# Patient Record
Sex: Female | Born: 1970 | Race: White | Hispanic: No | Marital: Married | Smoking: Never smoker
Health system: Southern US, Community
[De-identification: ages and names within clinical notes are randomized; demographics above are authoritative.]

## PROBLEM LIST (undated history)

## (undated) DIAGNOSIS — M8580 Other specified disorders of bone density and structure, unspecified site: Secondary | ICD-10-CM

## (undated) DIAGNOSIS — K589 Irritable bowel syndrome without diarrhea: Secondary | ICD-10-CM

## (undated) DIAGNOSIS — T7840XA Allergy, unspecified, initial encounter: Secondary | ICD-10-CM

## (undated) DIAGNOSIS — K222 Esophageal obstruction: Secondary | ICD-10-CM

## (undated) HISTORY — DX: Other specified disorders of bone density and structure, unspecified site: M85.80

## (undated) HISTORY — DX: Irritable bowel syndrome without diarrhea: K58.9

## (undated) HISTORY — DX: Esophageal obstruction: K22.2

## (undated) HISTORY — DX: Allergy, unspecified, initial encounter: T78.40XA

## (undated) HISTORY — PX: CERVICAL BIOPSY  W/ LOOP ELECTRODE EXCISION: SUR135

---

## 1996-04-02 HISTORY — PX: BREAST BIOPSY: SHX20

## 1997-09-03 ENCOUNTER — Ambulatory Visit (HOSPITAL_COMMUNITY): Admission: RE | Admit: 1997-09-03 | Discharge: 1997-09-03 | Payer: Self-pay | Admitting: *Deleted

## 1998-08-30 ENCOUNTER — Other Ambulatory Visit: Admission: RE | Admit: 1998-08-30 | Discharge: 1998-08-30 | Payer: Self-pay | Admitting: Obstetrics and Gynecology

## 1999-06-20 ENCOUNTER — Ambulatory Visit (HOSPITAL_COMMUNITY): Admission: RE | Admit: 1999-06-20 | Discharge: 1999-06-20 | Payer: Self-pay | Admitting: Obstetrics and Gynecology

## 1999-06-20 ENCOUNTER — Encounter: Payer: Self-pay | Admitting: Obstetrics and Gynecology

## 2000-10-17 ENCOUNTER — Other Ambulatory Visit: Admission: RE | Admit: 2000-10-17 | Discharge: 2000-10-17 | Payer: Self-pay | Admitting: Obstetrics and Gynecology

## 2001-04-04 ENCOUNTER — Ambulatory Visit (HOSPITAL_COMMUNITY): Admission: RE | Admit: 2001-04-04 | Discharge: 2001-04-04 | Payer: Self-pay | Admitting: Internal Medicine

## 2001-04-28 ENCOUNTER — Other Ambulatory Visit: Admission: RE | Admit: 2001-04-28 | Discharge: 2001-04-28 | Payer: Self-pay | Admitting: Obstetrics and Gynecology

## 2002-05-19 ENCOUNTER — Other Ambulatory Visit: Admission: RE | Admit: 2002-05-19 | Discharge: 2002-05-19 | Payer: Self-pay | Admitting: Obstetrics and Gynecology

## 2003-08-06 ENCOUNTER — Other Ambulatory Visit: Admission: RE | Admit: 2003-08-06 | Discharge: 2003-08-06 | Payer: Self-pay | Admitting: Obstetrics and Gynecology

## 2004-07-20 ENCOUNTER — Other Ambulatory Visit: Admission: RE | Admit: 2004-07-20 | Discharge: 2004-07-20 | Payer: Self-pay | Admitting: Obstetrics and Gynecology

## 2004-08-17 ENCOUNTER — Ambulatory Visit (HOSPITAL_COMMUNITY): Admission: RE | Admit: 2004-08-17 | Discharge: 2004-08-17 | Payer: Self-pay | Admitting: Family Medicine

## 2005-06-13 ENCOUNTER — Inpatient Hospital Stay (HOSPITAL_COMMUNITY): Admission: AD | Admit: 2005-06-13 | Discharge: 2005-06-17 | Payer: Self-pay | Admitting: Obstetrics and Gynecology

## 2005-07-23 ENCOUNTER — Other Ambulatory Visit: Admission: RE | Admit: 2005-07-23 | Discharge: 2005-07-23 | Payer: Self-pay | Admitting: Obstetrics and Gynecology

## 2011-10-28 ENCOUNTER — Emergency Department (HOSPITAL_COMMUNITY)
Admission: EM | Admit: 2011-10-28 | Discharge: 2011-10-28 | Disposition: A | Discharge status: Home / Self Care | Payer: 59 | Source: Home / Self Care

## 2014-01-22 ENCOUNTER — Other Ambulatory Visit (HOSPITAL_COMMUNITY): Payer: Self-pay | Admitting: Obstetrics and Gynecology

## 2014-01-22 DIAGNOSIS — R1011 Right upper quadrant pain: Secondary | ICD-10-CM

## 2014-01-27 ENCOUNTER — Ambulatory Visit (HOSPITAL_COMMUNITY): Payer: 59

## 2014-06-11 ENCOUNTER — Other Ambulatory Visit (HOSPITAL_COMMUNITY)
Admission: RE | Admit: 2014-06-11 | Discharge: 2014-06-11 | Disposition: A | Discharge status: Home / Self Care | Payer: 59 | Source: Ambulatory Visit | Attending: Obstetrics and Gynecology | Admitting: Obstetrics and Gynecology

## 2014-06-11 DIAGNOSIS — R05 Cough: Secondary | ICD-10-CM | POA: Diagnosis present

## 2014-06-11 LAB — CBC
HCT: 35.1 % — ABNORMAL LOW (ref 36.0–46.0)
Hemoglobin: 12.8 g/dL (ref 12.0–15.0)
MCH: 33.9 pg (ref 26.0–34.0)
MCHC: 36.5 g/dL — ABNORMAL HIGH (ref 30.0–36.0)
MCV: 92.9 fL (ref 78.0–100.0)
Platelets: 201 10*3/uL (ref 150–400)
RBC: 3.78 MIL/uL — ABNORMAL LOW (ref 3.87–5.11)
RDW: 11.6 % (ref 11.5–15.5)
WBC: 5.7 10*3/uL (ref 4.0–10.5)

## 2014-06-11 LAB — COMPREHENSIVE METABOLIC PANEL
ALT: 11 U/L (ref 0–35)
AST: 20 U/L (ref 0–37)
Albumin: 4.1 g/dL (ref 3.5–5.2)
Alkaline Phosphatase: 40 U/L (ref 39–117)
Anion gap: 12 (ref 5–15)
BUN: 8 mg/dL (ref 6–23)
CO2: 30 mmol/L (ref 19–32)
Calcium: 9.2 mg/dL (ref 8.4–10.5)
Chloride: 97 mmol/L (ref 96–112)
Creatinine, Ser: 0.62 mg/dL (ref 0.50–1.10)
GFR calc Af Amer: >90 mL/min (ref >90)
GFR calc non Af Amer: >90 mL/min (ref >90)
Glucose, Bld: 116 mg/dL — ABNORMAL HIGH (ref 70–99)
Potassium: 3.7 mmol/L (ref 3.5–5.1)
Sodium: 139 mmol/L (ref 135–145)
Total Bilirubin: 1 mg/dL (ref 0.3–1.2)
Total Protein: 7 g/dL (ref 6.0–8.3)

## 2014-06-11 LAB — TSH: TSH: 1.36 u[IU]/mL (ref 0.350–4.500)

## 2014-06-11 LAB — VITAMIN B12: Vitamin B-12: 832 pg/mL (ref 211–911)

## 2014-06-12 LAB — PROLACTIN: Prolactin: 7.3 ng/mL (ref 4.8–23.3)

## 2014-12-21 ENCOUNTER — Other Ambulatory Visit (HOSPITAL_COMMUNITY)
Admission: AD | Admit: 2014-12-21 | Discharge: 2014-12-21 | Disposition: A | Discharge status: Home / Self Care | Payer: 59 | Source: Ambulatory Visit | Attending: Obstetrics and Gynecology | Admitting: Obstetrics and Gynecology

## 2014-12-21 DIAGNOSIS — E559 Vitamin D deficiency, unspecified: Secondary | ICD-10-CM | POA: Diagnosis not present

## 2014-12-22 LAB — VITAMIN D 25 HYDROXY (VIT D DEFICIENCY, FRACTURES): Vit D, 25-Hydroxy: 35.4 ng/mL (ref 30.0–100.0)

## 2015-05-10 DIAGNOSIS — D2271 Melanocytic nevi of right lower limb, including hip: Secondary | ICD-10-CM | POA: Diagnosis not present

## 2015-05-10 DIAGNOSIS — L821 Other seborrheic keratosis: Secondary | ICD-10-CM | POA: Diagnosis not present

## 2015-05-10 DIAGNOSIS — L718 Other rosacea: Secondary | ICD-10-CM | POA: Diagnosis not present

## 2015-05-10 DIAGNOSIS — D2272 Melanocytic nevi of left lower limb, including hip: Secondary | ICD-10-CM | POA: Diagnosis not present

## 2015-05-10 DIAGNOSIS — D485 Neoplasm of uncertain behavior of skin: Secondary | ICD-10-CM | POA: Diagnosis not present

## 2015-05-10 DIAGNOSIS — L814 Other melanin hyperpigmentation: Secondary | ICD-10-CM | POA: Diagnosis not present

## 2015-11-14 DIAGNOSIS — H524 Presbyopia: Secondary | ICD-10-CM | POA: Diagnosis not present

## 2015-11-14 DIAGNOSIS — H52223 Regular astigmatism, bilateral: Secondary | ICD-10-CM | POA: Diagnosis not present

## 2015-11-23 DIAGNOSIS — L821 Other seborrheic keratosis: Secondary | ICD-10-CM | POA: Diagnosis not present

## 2015-12-27 DIAGNOSIS — Z01419 Encounter for gynecological examination (general) (routine) without abnormal findings: Secondary | ICD-10-CM | POA: Diagnosis not present

## 2015-12-27 DIAGNOSIS — Z13228 Encounter for screening for other metabolic disorders: Secondary | ICD-10-CM | POA: Diagnosis not present

## 2015-12-27 DIAGNOSIS — Z1231 Encounter for screening mammogram for malignant neoplasm of breast: Secondary | ICD-10-CM | POA: Diagnosis not present

## 2015-12-27 DIAGNOSIS — Z6822 Body mass index (BMI) 22.0-22.9, adult: Secondary | ICD-10-CM | POA: Diagnosis not present

## 2015-12-27 DIAGNOSIS — Z131 Encounter for screening for diabetes mellitus: Secondary | ICD-10-CM | POA: Diagnosis not present

## 2015-12-27 DIAGNOSIS — Z1321 Encounter for screening for nutritional disorder: Secondary | ICD-10-CM | POA: Diagnosis not present

## 2015-12-27 DIAGNOSIS — Z1329 Encounter for screening for other suspected endocrine disorder: Secondary | ICD-10-CM | POA: Diagnosis not present

## 2015-12-27 DIAGNOSIS — Z130 Encounter for screening for diseases of the blood and blood-forming organs and certain disorders involving the immune mechanism: Secondary | ICD-10-CM | POA: Diagnosis not present

## 2016-05-17 ENCOUNTER — Encounter: Payer: Self-pay | Admitting: Physician Assistant

## 2016-05-28 ENCOUNTER — Other Ambulatory Visit: Payer: Self-pay | Admitting: *Deleted

## 2016-05-30 ENCOUNTER — Ambulatory Visit: Payer: 59 | Admitting: Physician Assistant

## 2016-06-05 DIAGNOSIS — D2272 Melanocytic nevi of left lower limb, including hip: Secondary | ICD-10-CM | POA: Diagnosis not present

## 2016-06-05 DIAGNOSIS — L718 Other rosacea: Secondary | ICD-10-CM | POA: Diagnosis not present

## 2016-06-05 DIAGNOSIS — D2271 Melanocytic nevi of right lower limb, including hip: Secondary | ICD-10-CM | POA: Diagnosis not present

## 2016-06-05 DIAGNOSIS — D485 Neoplasm of uncertain behavior of skin: Secondary | ICD-10-CM | POA: Diagnosis not present

## 2016-06-05 DIAGNOSIS — L821 Other seborrheic keratosis: Secondary | ICD-10-CM | POA: Diagnosis not present

## 2016-06-05 DIAGNOSIS — D225 Melanocytic nevi of trunk: Secondary | ICD-10-CM | POA: Diagnosis not present

## 2016-06-05 DIAGNOSIS — D224 Melanocytic nevi of scalp and neck: Secondary | ICD-10-CM | POA: Diagnosis not present

## 2016-06-20 ENCOUNTER — Ambulatory Visit: Payer: 59 | Admitting: Gastroenterology

## 2016-12-27 DIAGNOSIS — D2272 Melanocytic nevi of left lower limb, including hip: Secondary | ICD-10-CM | POA: Diagnosis not present

## 2016-12-27 DIAGNOSIS — D225 Melanocytic nevi of trunk: Secondary | ICD-10-CM | POA: Diagnosis not present

## 2016-12-27 DIAGNOSIS — D485 Neoplasm of uncertain behavior of skin: Secondary | ICD-10-CM | POA: Diagnosis not present

## 2016-12-27 DIAGNOSIS — D2271 Melanocytic nevi of right lower limb, including hip: Secondary | ICD-10-CM | POA: Diagnosis not present

## 2016-12-27 DIAGNOSIS — L718 Other rosacea: Secondary | ICD-10-CM | POA: Diagnosis not present

## 2016-12-27 DIAGNOSIS — L821 Other seborrheic keratosis: Secondary | ICD-10-CM | POA: Diagnosis not present

## 2017-01-29 DIAGNOSIS — Z01419 Encounter for gynecological examination (general) (routine) without abnormal findings: Secondary | ICD-10-CM | POA: Diagnosis not present

## 2017-01-29 DIAGNOSIS — Z13228 Encounter for screening for other metabolic disorders: Secondary | ICD-10-CM | POA: Diagnosis not present

## 2017-01-29 DIAGNOSIS — Z1321 Encounter for screening for nutritional disorder: Secondary | ICD-10-CM | POA: Diagnosis not present

## 2017-01-29 DIAGNOSIS — Z13220 Encounter for screening for lipoid disorders: Secondary | ICD-10-CM | POA: Diagnosis not present

## 2017-01-29 DIAGNOSIS — Z1231 Encounter for screening mammogram for malignant neoplasm of breast: Secondary | ICD-10-CM | POA: Diagnosis not present

## 2017-01-29 DIAGNOSIS — Z130 Encounter for screening for diseases of the blood and blood-forming organs and certain disorders involving the immune mechanism: Secondary | ICD-10-CM | POA: Diagnosis not present

## 2017-01-29 DIAGNOSIS — Z6821 Body mass index (BMI) 21.0-21.9, adult: Secondary | ICD-10-CM | POA: Diagnosis not present

## 2017-01-29 LAB — HM PAP SMEAR: HM Pap smear: NEGATIVE

## 2017-06-27 DIAGNOSIS — D2271 Melanocytic nevi of right lower limb, including hip: Secondary | ICD-10-CM | POA: Diagnosis not present

## 2017-06-27 DIAGNOSIS — L570 Actinic keratosis: Secondary | ICD-10-CM | POA: Diagnosis not present

## 2017-06-27 DIAGNOSIS — L821 Other seborrheic keratosis: Secondary | ICD-10-CM | POA: Diagnosis not present

## 2017-06-27 DIAGNOSIS — L718 Other rosacea: Secondary | ICD-10-CM | POA: Diagnosis not present

## 2017-06-27 DIAGNOSIS — D485 Neoplasm of uncertain behavior of skin: Secondary | ICD-10-CM | POA: Diagnosis not present

## 2017-06-27 DIAGNOSIS — D225 Melanocytic nevi of trunk: Secondary | ICD-10-CM | POA: Diagnosis not present

## 2017-06-27 DIAGNOSIS — D2272 Melanocytic nevi of left lower limb, including hip: Secondary | ICD-10-CM | POA: Diagnosis not present

## 2017-10-07 DIAGNOSIS — H524 Presbyopia: Secondary | ICD-10-CM | POA: Diagnosis not present

## 2017-10-07 DIAGNOSIS — H5201 Hypermetropia, right eye: Secondary | ICD-10-CM | POA: Diagnosis not present

## 2017-10-07 DIAGNOSIS — H5212 Myopia, left eye: Secondary | ICD-10-CM | POA: Diagnosis not present

## 2017-10-07 DIAGNOSIS — H52223 Regular astigmatism, bilateral: Secondary | ICD-10-CM | POA: Diagnosis not present

## 2017-12-12 DIAGNOSIS — L821 Other seborrheic keratosis: Secondary | ICD-10-CM | POA: Diagnosis not present

## 2017-12-12 DIAGNOSIS — D2262 Melanocytic nevi of left upper limb, including shoulder: Secondary | ICD-10-CM | POA: Diagnosis not present

## 2017-12-12 DIAGNOSIS — D225 Melanocytic nevi of trunk: Secondary | ICD-10-CM | POA: Diagnosis not present

## 2017-12-12 DIAGNOSIS — D2272 Melanocytic nevi of left lower limb, including hip: Secondary | ICD-10-CM | POA: Diagnosis not present

## 2017-12-12 DIAGNOSIS — D2261 Melanocytic nevi of right upper limb, including shoulder: Secondary | ICD-10-CM | POA: Diagnosis not present

## 2018-01-20 ENCOUNTER — Ambulatory Visit: Payer: 59 | Admitting: Family Medicine

## 2018-02-06 DIAGNOSIS — Z1231 Encounter for screening mammogram for malignant neoplasm of breast: Secondary | ICD-10-CM | POA: Diagnosis not present

## 2018-02-06 DIAGNOSIS — Z6822 Body mass index (BMI) 22.0-22.9, adult: Secondary | ICD-10-CM | POA: Diagnosis not present

## 2018-02-06 DIAGNOSIS — Z01419 Encounter for gynecological examination (general) (routine) without abnormal findings: Secondary | ICD-10-CM | POA: Diagnosis not present

## 2018-02-06 LAB — HM MAMMOGRAPHY

## 2018-03-11 ENCOUNTER — Encounter: Payer: Self-pay | Admitting: Nurse Practitioner

## 2018-04-09 ENCOUNTER — Encounter: Payer: Self-pay | Admitting: Family Medicine

## 2018-04-09 ENCOUNTER — Ambulatory Visit: Payer: 59 | Admitting: Family Medicine

## 2018-04-09 VITALS — BP 118/64 | HR 72 | Temp 98.6°F | Ht 63.0 in | Wt 120.4 lb

## 2018-04-09 DIAGNOSIS — Z13220 Encounter for screening for lipoid disorders: Secondary | ICD-10-CM

## 2018-04-09 DIAGNOSIS — Z1331 Encounter for screening for depression: Secondary | ICD-10-CM

## 2018-04-09 DIAGNOSIS — Z9189 Other specified personal risk factors, not elsewhere classified: Secondary | ICD-10-CM

## 2018-04-09 DIAGNOSIS — Z0000 Encounter for general adult medical examination without abnormal findings: Secondary | ICD-10-CM | POA: Diagnosis not present

## 2018-04-09 LAB — COMPREHENSIVE METABOLIC PANEL
ALT: 10 U/L (ref 0–35)
AST: 20 U/L (ref 0–37)
Albumin: 4.8 g/dL (ref 3.5–5.2)
Alkaline Phosphatase: 42 U/L (ref 39–117)
BUN: 11 mg/dL (ref 6–23)
CO2: 27 mEq/L (ref 19–32)
Calcium: 9.9 mg/dL (ref 8.4–10.5)
Chloride: 101 mEq/L (ref 96–112)
Creatinine, Ser: 0.78 mg/dL (ref 0.40–1.20)
GFR: 84.58 mL/min (ref >60.00)
Glucose, Bld: 94 mg/dL (ref 70–99)
Potassium: 4.7 mEq/L (ref 3.5–5.1)
Sodium: 138 mEq/L (ref 135–145)
Total Bilirubin: 0.9 mg/dL (ref 0.2–1.2)
Total Protein: 7.3 g/dL (ref 6.0–8.3)

## 2018-04-09 LAB — CBC WITH DIFFERENTIAL/PLATELET
Basophils Absolute: 0.1 10*3/uL (ref 0.0–0.1)
Basophils Relative: 1 % (ref 0.0–3.0)
Eosinophils Absolute: 0.1 10*3/uL (ref 0.0–0.7)
Eosinophils Relative: 1.2 % (ref 0.0–5.0)
HCT: 42.3 % (ref 36.0–46.0)
Hemoglobin: 14.9 g/dL (ref 12.0–15.0)
Lymphocytes Relative: 26.2 % (ref 12.0–46.0)
Lymphs Abs: 2 10*3/uL (ref 0.7–4.0)
MCHC: 35.2 g/dL (ref 30.0–36.0)
MCV: 98.6 fl (ref 78.0–100.0)
Monocytes Absolute: 0.4 10*3/uL (ref 0.1–1.0)
Monocytes Relative: 5.1 % (ref 3.0–12.0)
Neutro Abs: 5.2 10*3/uL (ref 1.4–7.7)
Neutrophils Relative %: 66.5 % (ref 43.0–77.0)
Platelets: 287 10*3/uL (ref 150.0–400.0)
RBC: 4.29 Mil/uL (ref 3.87–5.11)
RDW: 12.3 % (ref 11.5–15.5)
WBC: 7.8 10*3/uL (ref 4.0–10.5)

## 2018-04-09 LAB — LIPID PANEL
Cholesterol: 155 mg/dL (ref 0–200)
HDL: 81.7 mg/dL (ref >39.00)
LDL Cholesterol: 62 mg/dL (ref 0–99)
NonHDL: 73.53
Total CHOL/HDL Ratio: 2
Triglycerides: 60 mg/dL (ref 0.0–149.0)
VLDL: 12 mg/dL (ref 0.0–40.0)

## 2018-04-09 NOTE — Progress Notes (Signed)
   Subjective:    Carolyn Guzman is a 48 y.o. female and is here for a comprehensive physical exam.  Health Maintenance Due  Topic Date Due  . HIV Screening  06/16/1987  . TETANUS/TDAP  06/16/1991  . PAP SMEAR-Modifier  06/15/1993   PMHx, SurgHx, SocialHx, Medications, and Allergies were reviewed in the Visit Navigator and updated as appropriate.   Past Medical History:  Diagnosis Date  . Esophageal stricture   . IBS (irritable bowel syndrome)      Past Surgical History:  Procedure Laterality Date  . BREAST BIOPSY Left 1998  . CESAREAN SECTION  2007     Family History  Problem Relation Age of Onset  . Depression Mother   . Hypertension Mother    Social History   Tobacco Use  . Smoking status: Never Smoker  . Smokeless tobacco: Never Used  Substance Use Topics  . Alcohol use: Yes  . Drug use: Never   Review of Systems:   Pertinent items are noted in the HPI. Otherwise, ROS is negative.  Objective:   BP 118/64   Pulse 72   Temp 98.6 F (37 C) (Oral)   Ht 5\' 3"  (1.6 m)   Wt 120 lb 6.4 oz (54.6 kg)   SpO2 98%   BMI 21.33 kg/m   General appearance: alert, cooperative and appears stated age. Head: normocephalic, without obvious abnormality, atraumatic. Neck: no adenopathy, supple, symmetrical, trachea midline; thyroid not enlarged, symmetric, no tenderness/mass/nodules. Lungs: clear to auscultation bilaterally. Heart: regular rate and rhythm Abdomen: soft, non-tender; no masses,  no organomegaly. Extremities: extremities normal, atraumatic, no cyanosis or edema. Skin: skin color, texture, turgor normal, no rashes or lesions. Lymph: cervical, supraclavicular, and axillary nodes normal; no abnormal inguinal nodes palpated. Neurologic: grossly normal.  Assessment/Plan:   Carolyn Guzman was seen today for establish care.  Diagnoses and all orders for this visit:  Routine physical examination  Screening for lipid disorders -     Lipid panel  History  of weight change -     CBC with Differential/Platelet -     Comprehensive metabolic panel    Patient Counseling: [x]    Nutrition: Stressed importance of moderation in sodium/caffeine intake, saturated fat and cholesterol, caloric balance, sufficient intake of fresh fruits, vegetables, fiber, calcium, iron, and 1 mg of folate supplement per day (for females capable of pregnancy).  [x]    Stressed the importance of regular exercise.   [x]    Substance Abuse: Discussed cessation/primary prevention of tobacco, alcohol, or other drug use; driving or other dangerous activities under the influence; availability of treatment for abuse.   [x]    Injury prevention: Discussed safety belts, safety helmets, smoke detector, smoking near bedding or upholstery.   [x]    Sexuality: Discussed sexually transmitted diseases, partner selection, use of condoms, avoidance of unintended pregnancy  and contraceptive alternatives.  [x]    Dental health: Discussed importance of regular tooth brushing, flossing, and dental visits.  [x]    Health maintenance and immunizations reviewed. Please refer to Health maintenance section.   Briscoe Deutscher, DO [place]

## 2018-04-17 ENCOUNTER — Ambulatory Visit: Payer: 59 | Admitting: Physician Assistant

## 2018-04-17 ENCOUNTER — Ambulatory Visit: Payer: 59 | Admitting: Nurse Practitioner

## 2018-04-17 ENCOUNTER — Encounter: Payer: Self-pay | Admitting: Physician Assistant

## 2018-04-17 VITALS — BP 118/64 | HR 68 | Ht 63.0 in | Wt 120.0 lb

## 2018-04-17 DIAGNOSIS — K581 Irritable bowel syndrome with constipation: Secondary | ICD-10-CM | POA: Diagnosis not present

## 2018-04-17 NOTE — Progress Notes (Addendum)
Chief Complaint: IBS  HPI:    Carolyn Guzman is a 48 year old Caucasian female with a past medical history as listed below, who presents to clinic today with a complaint of IBS.    04/09/2018 CBC, CMP and lipid panel were normal.    Today, the patient presents to clinic and explains that she is really just here to establish care.  She was diagnosed with IBS years ago in her 27s and has dealt with off-and-on symptoms of this over the years, but is pretty good at controlling them with her diet and lifestyle. Typically constipated and currently has been using a lot of "organic cleanses" in order to maintain regular bowel movements.  Has tried most over-the-counter laxatives in the past including MiraLAX which she did not like because it made her so gassy.  Has also tried a prescription medicine but likes to try and avoid these if she can and do things the natural way.      Does admit to eating a healthy diet and doing CrossFit on a regular basis, but unsure if she is meeting the fiber requirements of 25-35 g/day.  Also works as a Marine scientist in TEFL teacher at [employer] and tells me that she is somewhat of a hypochondriac which makes her anxious.    Denies fever, chills, weight loss, blood in her stool, nausea, vomiting, heartburn, reflux or symptoms that awaken her from sleep.  Past Medical History:  Diagnosis Date  . Esophageal stricture   . IBS (irritable bowel syndrome)     Past Surgical History:  Procedure Laterality Date  . BREAST BIOPSY Left 1998  . CESAREAN SECTION  2007    No current outpatient medications on file.   No current facility-administered medications for this visit.     Allergies as of 04/17/2018  . (No Known Allergies)    Family History  Problem Relation Age of Onset  . Depression Mother   . Hypertension Mother     Social History   Socioeconomic History  . Marital status: Married    Spouse name: Not on file  . Number of children: Not on file  . Years of  education: Not on file  . Highest education level: Not on file  Occupational History  . Occupation: Therapist, sports PRE[employer]    Employer: [employer]  Social Needs  . Financial resource strain: Not on file  . Food insecurity:    Worry: Not on file    Inability: Not on file  . Transportation needs:    Medical: Not on file    Non-medical: Not on file  Tobacco Use  . Smoking status: Never Smoker  . Smokeless tobacco: Never Used  Substance and Sexual Activity  . Alcohol use: Yes  . Drug use: Never  . Sexual activity: Yes    Partners: Male  Lifestyle  . Physical activity:    Days per week: Not on file    Minutes per session: Not on file  . Stress: Not on file  Relationships  . Social connections:    Talks on phone: Not on file    Gets together: Not on file    Attends religious service: Not on file    Active member of club or organization: Not on file    Attends meetings of clubs or organizations: Not on file    Relationship status: Not on file  . Intimate partner violence:    Fear of current or ex partner: Not on file    Emotionally abused:  Not on file    Physically abused: Not on file    Forced sexual activity: Not on file  Other Topics Concern  . 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     Review of Systems:    Constitutional: No weight loss, fever or chills Skin: No rash  Cardiovascular: No chest pain  Respiratory: No SOB Gastrointestinal: See HPI and otherwise negative Genitourinary: No dysuria  Neurological: No headache, dizziness or syncope Musculoskeletal: No new muscle or joint pain Hematologic: No bleeding  Psychiatric: No history of depression or anxiety   Physical Exam:  Vital signs: BP 118/64   Pulse 68   Ht 5\' 3"  (1.6 m)   Wt 120 lb (54.4 kg)   BMI 21.26 kg/m   Constitutional:   Pleasant Caucasian female appears to be in NAD, Well developed, Well nourished, alert and cooperative Head:  Normocephalic and  atraumatic. Eyes:   PEERL, EOMI. No icterus. Conjunctiva pink. Ears:  Normal auditory acuity. Neck:  Supple Throat: Oral cavity and pharynx without inflammation, swelling or lesion.  Respiratory: Respirations even and unlabored. Lungs clear to auscultation bilaterally.   No wheezes, crackles, or rhonchi.  Cardiovascular: Normal S1, S2. No MRG. Regular rate and rhythm. No peripheral edema, cyanosis or pallor.  Gastrointestinal:  Soft, nondistended, nontender. No rebound or guarding. Normal bowel sounds. No appreciable masses or hepatomegaly. Rectal:  Not performed.  Msk:  Symmetrical without gross deformities. Without edema, no deformity or joint abnormality.  Neurologic:  Alert and  oriented x4;  grossly normal neurologically.  Skin:   Dry and intact without significant lesions or rashes. Psychiatric: Demonstrates good judgement and reason without abnormal affect or behaviors.  RELEVANT LABS AND IMAGING: CBC    Component Value Date/Time   WBC 7.8 04/09/2018 0952   RBC 4.29 04/09/2018 0952   HGB 14.9 04/09/2018 0952   HCT 42.3 04/09/2018 0952   PLT 287.0 04/09/2018 0952   MCV 98.6 04/09/2018 0952   MCH 33.9 06/11/2014 0800   MCHC 35.2 04/09/2018 0952   RDW 12.3 04/09/2018 0952   LYMPHSABS 2.0 04/09/2018 0952   MONOABS 0.4 04/09/2018 0952   EOSABS 0.1 04/09/2018 0952   BASOSABS 0.1 04/09/2018 0952    CMP     Component Value Date/Time   NA 138 04/09/2018 0952   K 4.7 04/09/2018 0952   CL 101 04/09/2018 0952   CO2 27 04/09/2018 0952   GLUCOSE 94 04/09/2018 0952   BUN 11 04/09/2018 0952   CREATININE 0.78 04/09/2018 0952   CALCIUM 9.9 04/09/2018 0952   PROT 7.3 04/09/2018 0952   ALBUMIN 4.8 04/09/2018 0952   AST 20 04/09/2018 0952   ALT 10 04/09/2018 0952   ALKPHOS 42 04/09/2018 0952   BILITOT 0.9 04/09/2018 0952   GFRNONAA >90 06/11/2014 0800   GFRAA >90 06/11/2014 0800    Assessment: 1. IBS-C: Chronic for the patient, mostly controlled with diet and  lifestyle  Plan: 1.  Reports having EGD and colonoscopy back in 2003 with Dr. Henrene Pastor but is requesting to switch her care to Dr. Carlean Purl today. 2.  Discussed IBS.  Provided her with a copy of the low FODMAP diet today and discussed this in detail. 3.  Discussed that cleanses may not be the best way to go to keep regular bowel movements, encouraged her to increase fiber at first to give this a try.  Discussed 25-35 g/day through her diet or a fiber supplement. 4.  Discussed probiotics, she has tried some in the past but felt they made no difference. 5.  Discussed anti-spasmodics but the patient declines today. 6.  Patient to follow in clinic as needed with Dr. Carlean Purl or myself.  Ellouise Newer, PA-C [facility] 04/17/2018, 1:45 PM  Cc: Briscoe Deutscher, DO   Agree with Ms. Mikaia Janvier's evaluation and management.  Gatha Mayer, MD, [facility]

## 2018-04-17 NOTE — Patient Instructions (Addendum)
[email or web address]  Please check out the above web site for information about [employer]. We are also giving you information to read over today.   Follow up with [place] as needed.    I appreciate the opportunity to care for you.

## 2018-04-17 NOTE — Progress Notes (Signed)
Noted. Will receive primary GI care from Dr. Carlean Purl

## 2018-04-22 ENCOUNTER — Encounter: Payer: Self-pay | Admitting: Physical Therapy

## 2018-06-16 DIAGNOSIS — N951 Menopausal and female climacteric states: Secondary | ICD-10-CM | POA: Diagnosis not present

## 2018-07-08 DIAGNOSIS — R1032 Left lower quadrant pain: Secondary | ICD-10-CM | POA: Diagnosis not present

## 2018-07-08 DIAGNOSIS — K5900 Constipation, unspecified: Secondary | ICD-10-CM | POA: Diagnosis not present

## 2018-09-16 DIAGNOSIS — D2272 Melanocytic nevi of left lower limb, including hip: Secondary | ICD-10-CM | POA: Diagnosis not present

## 2018-09-16 DIAGNOSIS — L814 Other melanin hyperpigmentation: Secondary | ICD-10-CM | POA: Diagnosis not present

## 2018-09-16 DIAGNOSIS — D485 Neoplasm of uncertain behavior of skin: Secondary | ICD-10-CM | POA: Diagnosis not present

## 2018-09-16 DIAGNOSIS — D2239 Melanocytic nevi of other parts of face: Secondary | ICD-10-CM | POA: Diagnosis not present

## 2018-09-16 DIAGNOSIS — D224 Melanocytic nevi of scalp and neck: Secondary | ICD-10-CM | POA: Diagnosis not present

## 2018-09-16 DIAGNOSIS — L570 Actinic keratosis: Secondary | ICD-10-CM | POA: Diagnosis not present

## 2018-09-16 DIAGNOSIS — D225 Melanocytic nevi of trunk: Secondary | ICD-10-CM | POA: Diagnosis not present

## 2018-09-16 DIAGNOSIS — L821 Other seborrheic keratosis: Secondary | ICD-10-CM | POA: Diagnosis not present

## 2018-09-16 DIAGNOSIS — D2271 Melanocytic nevi of right lower limb, including hip: Secondary | ICD-10-CM | POA: Diagnosis not present

## 2019-02-16 DIAGNOSIS — Z6822 Body mass index (BMI) 22.0-22.9, adult: Secondary | ICD-10-CM | POA: Diagnosis not present

## 2019-02-16 DIAGNOSIS — Z01419 Encounter for gynecological examination (general) (routine) without abnormal findings: Secondary | ICD-10-CM | POA: Diagnosis not present

## 2019-02-16 DIAGNOSIS — Z9889 Other specified postprocedural states: Secondary | ICD-10-CM | POA: Insufficient documentation

## 2019-02-16 DIAGNOSIS — Z1231 Encounter for screening mammogram for malignant neoplasm of breast: Secondary | ICD-10-CM | POA: Diagnosis not present

## 2019-04-07 DIAGNOSIS — D225 Melanocytic nevi of trunk: Secondary | ICD-10-CM | POA: Diagnosis not present

## 2019-04-07 DIAGNOSIS — D2271 Melanocytic nevi of right lower limb, including hip: Secondary | ICD-10-CM | POA: Diagnosis not present

## 2019-04-07 DIAGNOSIS — L814 Other melanin hyperpigmentation: Secondary | ICD-10-CM | POA: Diagnosis not present

## 2019-04-07 DIAGNOSIS — L821 Other seborrheic keratosis: Secondary | ICD-10-CM | POA: Diagnosis not present

## 2019-04-07 DIAGNOSIS — L820 Inflamed seborrheic keratosis: Secondary | ICD-10-CM | POA: Diagnosis not present

## 2019-04-07 DIAGNOSIS — D224 Melanocytic nevi of scalp and neck: Secondary | ICD-10-CM | POA: Diagnosis not present

## 2019-04-07 DIAGNOSIS — D2261 Melanocytic nevi of right upper limb, including shoulder: Secondary | ICD-10-CM | POA: Diagnosis not present

## 2019-04-07 DIAGNOSIS — L0109 Other impetigo: Secondary | ICD-10-CM | POA: Diagnosis not present

## 2019-04-07 DIAGNOSIS — D2272 Melanocytic nevi of left lower limb, including hip: Secondary | ICD-10-CM | POA: Diagnosis not present

## 2019-04-10 ENCOUNTER — Other Ambulatory Visit: Payer: Self-pay

## 2019-04-13 ENCOUNTER — Encounter: Payer: 59 | Admitting: Family Medicine

## 2019-04-13 ENCOUNTER — Encounter: Payer: Self-pay | Admitting: Family Medicine

## 2019-04-13 ENCOUNTER — Ambulatory Visit (INDEPENDENT_AMBULATORY_CARE_PROVIDER_SITE_OTHER): Payer: 59 | Admitting: Family Medicine

## 2019-04-13 ENCOUNTER — Other Ambulatory Visit: Payer: Self-pay

## 2019-04-13 VITALS — BP 118/80 | HR 70 | Temp 97.5°F | Ht 63.0 in | Wt 122.6 lb

## 2019-04-13 DIAGNOSIS — Z23 Encounter for immunization: Secondary | ICD-10-CM

## 2019-04-13 DIAGNOSIS — Z0000 Encounter for general adult medical examination without abnormal findings: Secondary | ICD-10-CM | POA: Diagnosis not present

## 2019-04-13 DIAGNOSIS — Z8262 Family history of osteoporosis: Secondary | ICD-10-CM | POA: Diagnosis not present

## 2019-04-13 DIAGNOSIS — K581 Irritable bowel syndrome with constipation: Secondary | ICD-10-CM

## 2019-04-13 DIAGNOSIS — Z20822 Contact with and (suspected) exposure to covid-19: Secondary | ICD-10-CM

## 2019-04-13 LAB — CBC WITH DIFFERENTIAL/PLATELET
Basophils Absolute: 0 10*3/uL (ref 0.0–0.1)
Basophils Relative: 0.4 % (ref 0.0–3.0)
Eosinophils Absolute: 0.1 10*3/uL (ref 0.0–0.7)
Eosinophils Relative: 2.1 % (ref 0.0–5.0)
HCT: 39.5 % (ref 36.0–46.0)
Hemoglobin: 13.4 g/dL (ref 12.0–15.0)
Lymphocytes Relative: 28.1 % (ref 12.0–46.0)
Lymphs Abs: 1.9 10*3/uL (ref 0.7–4.0)
MCHC: 33.9 g/dL (ref 30.0–36.0)
MCV: 99.5 fl (ref 78.0–100.0)
Monocytes Absolute: 0.4 10*3/uL (ref 0.1–1.0)
Monocytes Relative: 6.2 % (ref 3.0–12.0)
Neutro Abs: 4.3 10*3/uL (ref 1.4–7.7)
Neutrophils Relative %: 63.2 % (ref 43.0–77.0)
Platelets: 239 10*3/uL (ref 150.0–400.0)
RBC: 3.97 Mil/uL (ref 3.87–5.11)
RDW: 12.5 % (ref 11.5–15.5)
WBC: 6.9 10*3/uL (ref 4.0–10.5)

## 2019-04-13 LAB — COMPREHENSIVE METABOLIC PANEL
ALT: 10 U/L (ref 0–35)
AST: 18 U/L (ref 0–37)
Albumin: 4.6 g/dL (ref 3.5–5.2)
Alkaline Phosphatase: 41 U/L (ref 39–117)
BUN: 10 mg/dL (ref 6–23)
CO2: 27 mEq/L (ref 19–32)
Calcium: 9.6 mg/dL (ref 8.4–10.5)
Chloride: 102 mEq/L (ref 96–112)
Creatinine, Ser: 0.8 mg/dL (ref 0.40–1.20)
GFR: 76.94 mL/min (ref >60.00)
Glucose, Bld: 102 mg/dL — ABNORMAL HIGH (ref 70–99)
Potassium: 3.9 mEq/L (ref 3.5–5.1)
Sodium: 137 mEq/L (ref 135–145)
Total Bilirubin: 1.6 mg/dL — ABNORMAL HIGH (ref 0.2–1.2)
Total Protein: 7.1 g/dL (ref 6.0–8.3)

## 2019-04-13 LAB — LIPID PANEL
Cholesterol: 154 mg/dL (ref 0–200)
HDL: 85 mg/dL (ref >39.00)
LDL Cholesterol: 56 mg/dL (ref 0–99)
NonHDL: 68.97
Total CHOL/HDL Ratio: 2
Triglycerides: 66 mg/dL (ref 0.0–149.0)
VLDL: 13.2 mg/dL (ref 0.0–40.0)

## 2019-04-13 LAB — VITAMIN D 25 HYDROXY (VIT D DEFICIENCY, FRACTURES): VITD: 27.86 ng/mL — ABNORMAL LOW (ref 30.00–100.00)

## 2019-04-13 NOTE — Patient Instructions (Addendum)
Please return in 12 months for your annual complete physical; please come fasting.  I will release your lab results to you on your MyChart account with further instructions. Please reply with any questions.   Today you were given your Tdap vaccination. This is good for 10 years.  It was a pleasure meeting you today! Thank you for choosing [place] to meet your healthcare needs! I truly look forward to working with you. If you have any questions or concerns, please send me a message via Mychart or call the office at [phone].  Please do these things to maintain good health!   Exercise at least 30-45 minutes a day,  4-5 days a week.   Eat a low-fat diet with lots of fruits and vegetables, up to 7-9 servings per day.  Drink plenty of water daily. Try to drink 8 8oz glasses per day.  Seatbelts can save your life. Always wear your seatbelt.  Place Smoke Detectors on every level of your home and check batteries every year.  Schedule an appointment with an eye doctor for an eye exam every 1-2 years  Safe sex - use condoms to protect yourself from STDs if you could be exposed to these types of infections. Use birth control if you do not want to become pregnant and are sexually active.  Avoid heavy alcohol use. If you drink, keep it to less than 2 drinks/day and not every day.  [place].  Choose someone you trust that could speak for you if you became unable to speak for yourself.  Depression is common in our stressful world.If you're feeling down or losing interest in things you normally enjoy, please come in for a visit.  If anyone is threatening or hurting you, please get help. Physical or Emotional Violence is never OK.    Calcium Intake Recommendations You can take Caltrate Plus twice a day or get it through your diet or other OTC supplements (Viactiv, OsCal etc)  Calcium is a mineral that affects many functions in the body, including:  Blood clotting.  Blood  vessel function.  Nerve impulse conduction.  Hormone secretion.  Muscle contraction.  Bone and teeth functions.  Most of your body's calcium supply is stored in your bones and teeth. When your calcium stores are low, you may be at risk for low bone mass, bone loss, and bone fractures. Consuming enough calcium helps to grow healthy bones and teeth and to prevent breakdown over time. It is very important that you get enough calcium if you are:  A child undergoing rapid growth.  An adolescent girl.  A pre- or post-menopausal woman.  A woman whose menstrual cycle has stopped due to anorexia nervosa or regular intense exercise.  An individual with lactose intolerance or a milk allergy.  A vegetarian.  What is my plan? Try to consume the recommended amount of calcium daily based on your age. Depending on your overall health, your health care provider may recommend increased calcium intake.General daily calcium intake recommendations by age are:  Birth to 6 months: 200 mg.  Infants 7 to 12 months: 260 mg.  Children 1 to 3 years: 700 mg.  Children 4 to 8 years: 1,000 mg.  Children 9 to 13 years: 1,300 mg.  Teens 14 to 18 years: 1,300 mg.  Adults 19 to 50 years: 1,000 mg.  Adult women 51 to 70 years: 1,200 mg.  Adult men 51 to 70 years: 1,000 mg.  Adults 71 years and older:  1,200 mg.  Pregnant and breastfeeding teens: 1,300 mg.  Pregnant and breastfeeding adults: 1,000 mg.  What do I need to know about calcium intake?  In order for the body to absorb calcium, it needs vitamin D. You can get vitamin D through (we recommend getting [phone] units of Vitamin D daily) ? Direct exposure of the skin to sunlight. ? Foods, such as egg yolks, liver, saltwater fish, and fortified milk. ? Supplements.  Consuming too much calcium may cause: ? Constipation. ? Decreased absorption of iron and zinc. ? Kidney stones.  Calcium supplements may interact with certain medicines.  Check with your health care provider before starting any calcium supplements.  Try to get most of your calcium from food. What foods can I eat? Grains  Fortified oatmeal. Fortified ready-to-eat cereals. Fortified frozen waffles. Vegetables Turnip greens. Broccoli. Fruits Fortified orange juice. Meats and Other Protein Sources Canned sardines with bones. Canned salmon with bones. Soy beans. Tofu. Baked beans. Almonds. [place] nuts. Sunflower seeds. Dairy Milk. Yogurt. Cheese. Cottage cheese. Beverages Fortified soy milk. Fortified rice milk. Sweets/Desserts Pudding. Ice Cream. Milkshakes. Blackstrap molasses. The items listed above may not be a complete list of recommended foods or beverages. Contact your dietitian for more options. What foods can affect my calcium intake? It may be more difficult for your body to use calcium or calcium may leave your body more quickly if you consume large amounts of:  Sodium.  Protein.  Caffeine.  Alcohol.  This information is not intended to replace advice given to you by your health care provider. Make sure you discuss any questions you have with your health care provider. Document Released: 11/01/2003 Document Revised: 10/07/2015 Document Reviewed: 08/25/2013 Elsevier Interactive Patient Education  2018 [employer].

## 2019-04-13 NOTE — Progress Notes (Signed)
Subjective  Chief Complaint  Patient presents with  . Annual Exam    HPI: Carolyn Guzman is a 49 y.o. female who presents to [facility] [place] today for a Female Wellness Visit. TOC visit, new pt to me.   Wellness Visit: annual visit with health maintenance review and exam without Pap   HM: sees Dr. Gaetano Net for female wellness care. Had pap smear done in October. All is up to date. Due tdap and flu vaccinations. Fasting for labs.   Married Therapist, sports, healthy lifestyle. 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 Family is happy and healthy. 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   IBS-C: reviewed recent GI evaluation. Lifestyle controlled and stable.   Assessment  1. Annual physical exam   2. Irritable bowel syndrome with constipation   3. Family history of osteoporosis      Plan  Female Wellness Visit:  Age appropriate Health Maintenance and Prevention measures were discussed with patient. Included topics are cancer screening recommendations, ways to keep healthy (see AVS) including dietary and exercise recommendations, regular eye and dental care, use of seat belts, and avoidance of moderate alcohol use and tobacco use.   BMI: discussed patient's BMI and encouraged positive lifestyle modifications to help get to or maintain a target BMI.  HM needs and immunizations were addressed and ordered. See below for orders. See HM and immunization section for updates.  Routine labs and screening tests ordered including cmp, cbc and lipids where appropriate.  Discussed recommendations regarding Vit D and calcium supplementation (see AVS)  Follow up: Return in about 1 year (around 04/12/2020) for complete physical.   Orders Placed This Encounter  Procedures  . CBC with Differential/Platelet  . Comprehensive metabolic panel  . Lipid panel  . HIV antibody  . Vit D 25OH   No orders of the defined types were placed in this encounter.    Lifestyle: Body mass index is 21.72 kg/m. Wt Readings  from Last 3 Encounters:  04/13/19 122 lb 9.6 oz (55.6 kg)  04/17/18 120 lb (54.4 kg)  04/09/18 120 lb 6.4 oz (54.6 kg)    Patient Active Problem List   Diagnosis Date Noted  . Irritable bowel syndrome with constipation 04/13/2019  . H/O LEEP 02/16/2019   Health Maintenance  Topic Date Due  . HIV Screening  06/16/1987  . TETANUS/TDAP  09/23/2014  . PAP SMEAR-Modifier  01/15/2022  . INFLUENZA VACCINE  Completed   Immunization History  Administered Date(s) Administered  . Influenza,inj,Quad PF,6+ Mos 01/20/2019  . Influenza-Unspecified 01/14/2018  . Tdap 09/22/2004   We updated and reviewed the patient's past history in detail and it is documented below. Allergies: Patient has No Known Allergies. Past Medical History Patient  has a past medical history of Esophageal stricture and IBS (irritable bowel syndrome). Past Surgical History Patient  has a past surgical history that includes Breast biopsy (Left, 1998); Cesarean section (2007); and Cervical biopsy w/ loop electrode excision. Family History: Patient family history includes Depression in her mother; Healthy in her daughter; Hypertension in her maternal grandfather, maternal grandmother, and mother; Lung cancer in her maternal grandfather; Osteoporosis in her maternal grandmother and mother. Social History:  Patient  reports that she has never smoked. She has never used smokeless tobacco. She reports current alcohol use. She reports that she does not use drugs.  Review of Systems: Constitutional: negative for fever or malaise Ophthalmic: negative for photophobia, double vision or loss of vision Cardiovascular: negative for chest pain, dyspnea on exertion, or new  LE swelling Respiratory: negative for SOB or persistent cough Gastrointestinal: negative for abdominal pain, change in bowel habits or melena Genitourinary: negative for dysuria or gross hematuria, no abnormal uterine bleeding or disharge Musculoskeletal: negative  for new gait disturbance or muscular weakness Integumentary: negative for new or persistent rashes, no breast lumps Neurological: negative for TIA or stroke symptoms Psychiatric: negative for SI or delusions Allergic/Immunologic: negative for hives  Patient Care Team    Relationship Specialty Notifications Start End  Leamon Arnt, MD PCP - General Family Medicine  04/13/19   Danella Sensing, MD Consulting Physician Dermatology  04/23/18   Everlene Farrier, MD Consulting Physician Obstetrics and Gynecology  04/23/18   Gatha Mayer, MD Consulting Physician Gastroenterology  04/13/19     Objective  Vitals: BP 118/80 (BP Location: Left Arm, Patient Position: Sitting, Cuff Size: Normal)   Pulse 70   Temp (!) 97.5 F (36.4 C) (Temporal)   Ht 5\' 3"  (1.6 m)   Wt 122 lb 9.6 oz (55.6 kg)   SpO2 99%   BMI 21.72 kg/m  General:  Well developed, well nourished, no acute distress  Psych:  Alert and orientedx3,normal mood and affect HEENT:  Normocephalic, atraumatic, non-icteric sclera, PERRL, oropharynx is clear without mass or exudate, supple neck without adenopathy, mass or thyromegaly Cardiovascular:  Normal S1, S2, RRR without gallop, rub or murmur, nondisplaced PMI Respiratory:  Good breath sounds bilaterally, CTAB with normal respiratory effort Gastrointestinal: normal bowel sounds, soft, non-tender, no noted masses. No HSM MSK: no deformities, contusions. Joints are without erythema or swelling. Spine and CVA region are nontender Skin:  Warm, no rashes or suspicious lesions noted Neurologic:    Mental status is normal. CN 2-11 are normal. Gross motor and sensory exams are normal. Normal gait. No tremor   Commons side effects, risks, benefits, and alternatives for medications and treatment plan prescribed today were discussed, and the patient expressed understanding of the given instructions. Patient is instructed to call or message via MyChart if he/she has any questions or concerns  regarding our treatment plan. No barriers to understanding were identified. We discussed Red Flag symptoms and signs in detail. Patient expressed understanding regarding what to do in case of urgent or emergency type symptoms.   Medication list was reconciled, printed and provided to the patient in AVS. Patient instructions and summary information was reviewed with the patient as documented in the AVS. This note was prepared with assistance of Dragon voice recognition software. Occasional wrong-word or sound-a-like substitutions may have occurred due to the inherent limitations of voice recognition software  This visit occurred during the SARS-CoV-2 public health emergency.  Safety protocols were in place, including screening questions prior to the visit, additional usage of staff PPE, and extensive cleaning of exam room while observing appropriate contact time as indicated for disinfecting solutions.

## 2019-04-13 NOTE — Addendum Note (Signed)
Addended bySerita Sheller on: 04/13/2019 09:44 AM   Modules accepted: Orders

## 2019-04-14 ENCOUNTER — Encounter: Payer: Self-pay | Admitting: Family Medicine

## 2019-04-14 LAB — HIV ANTIBODY (ROUTINE TESTING W REFLEX): HIV 1&2 Ab, 4th Generation: NONREACTIVE

## 2019-04-15 ENCOUNTER — Telehealth: Payer: Self-pay

## 2019-04-15 ENCOUNTER — Telehealth: Payer: Self-pay | Admitting: Family Medicine

## 2019-04-15 DIAGNOSIS — R17 Unspecified jaundice: Secondary | ICD-10-CM

## 2019-04-15 DIAGNOSIS — R1011 Right upper quadrant pain: Secondary | ICD-10-CM

## 2019-04-15 NOTE — Telephone Encounter (Signed)
Patient states that she missed a phone call from [place] . She is requesting that someone call her back at [phone].

## 2019-04-15 NOTE — Addendum Note (Signed)
Addended by: Billey Chang on: 04/15/2019 11:35 AM   Modules accepted: Orders

## 2019-04-15 NOTE — Telephone Encounter (Signed)
LVM stating that ultrasound was ordered and they will call her to get scheduled.

## 2019-04-15 NOTE — Telephone Encounter (Signed)
I sent her a note in her lab result.  I have now ordered the ultrasound.  Please call patient letting her know that I have ordered it. They will call her to get it scheduled.

## 2019-04-15 NOTE — Telephone Encounter (Signed)
Patient returned my call. She was notified that order has been placed.

## 2019-04-15 NOTE — Telephone Encounter (Signed)
Pt called stating that Dr. Jonni Sanger including instructions on an order for an ultra sound. Pt stated she was unable to find the instructions in her MyChart. Pt asked for nurse to call her back with instructions on the ultra sound and requested for it to be at [facility]. Please advise.

## 2019-04-15 NOTE — Telephone Encounter (Signed)
Which ultrasound is this pertaining to? I don't see any information in her chart.

## 2019-04-16 ENCOUNTER — Encounter: Payer: Self-pay | Admitting: Family Medicine

## 2019-04-16 ENCOUNTER — Ambulatory Visit (HOSPITAL_COMMUNITY)
Admission: RE | Admit: 2019-04-16 | Discharge: 2019-04-16 | Disposition: A | Discharge status: Home / Self Care | Payer: 59 | Source: Ambulatory Visit | Attending: Family Medicine | Admitting: Family Medicine

## 2019-04-16 ENCOUNTER — Telehealth: Payer: Self-pay | Admitting: Physician Assistant

## 2019-04-16 ENCOUNTER — Other Ambulatory Visit: Payer: Self-pay

## 2019-04-16 DIAGNOSIS — R17 Unspecified jaundice: Secondary | ICD-10-CM | POA: Insufficient documentation

## 2019-04-16 DIAGNOSIS — R1011 Right upper quadrant pain: Secondary | ICD-10-CM | POA: Diagnosis not present

## 2019-04-16 DIAGNOSIS — K824 Cholesterolosis of gallbladder: Secondary | ICD-10-CM | POA: Diagnosis not present

## 2019-04-16 NOTE — Telephone Encounter (Signed)
Spoke with pt and she is aware.

## 2019-04-16 NOTE — Telephone Encounter (Signed)
I reviewed the U/S. The recommendations are that polyps under 5 mm are 95% + of the time benign, so they recommend a surveillance U/S in a year to ensure it is not growing. She should not be worried about this.  JLL

## 2019-04-16 NOTE — Addendum Note (Signed)
Addended by: Billey Chang on: 04/16/2019 09:55 AM   Modules accepted: Orders

## 2019-04-16 NOTE — Telephone Encounter (Signed)
See note below and advise. PCP ordered the [place].

## 2019-05-18 ENCOUNTER — Other Ambulatory Visit: Payer: Self-pay

## 2019-05-19 ENCOUNTER — Other Ambulatory Visit (INDEPENDENT_AMBULATORY_CARE_PROVIDER_SITE_OTHER): Payer: 59

## 2019-05-19 DIAGNOSIS — R17 Unspecified jaundice: Secondary | ICD-10-CM

## 2019-05-19 LAB — HEPATIC FUNCTION PANEL
ALT: 11 U/L (ref 0–35)
AST: 17 U/L (ref 0–37)
Albumin: 4.6 g/dL (ref 3.5–5.2)
Alkaline Phosphatase: 34 U/L — ABNORMAL LOW (ref 39–117)
Bilirubin, Direct: 0.2 mg/dL (ref 0.0–0.3)
Total Bilirubin: 0.8 mg/dL (ref 0.2–1.2)
Total Protein: 6.7 g/dL (ref 6.0–8.3)

## 2019-05-20 ENCOUNTER — Other Ambulatory Visit: Payer: 59

## 2019-06-02 DIAGNOSIS — H5212 Myopia, left eye: Secondary | ICD-10-CM | POA: Diagnosis not present

## 2019-09-09 ENCOUNTER — Ambulatory Visit: Payer: 59 | Attending: Internal Medicine

## 2019-09-09 DIAGNOSIS — Z20822 Contact with and (suspected) exposure to covid-19: Secondary | ICD-10-CM

## 2019-09-10 LAB — SARS-COV-2, NAA 2 DAY TAT

## 2019-09-10 LAB — NOVEL CORONAVIRUS, NAA: SARS-CoV-2, NAA: NOT DETECTED

## 2020-02-01 ENCOUNTER — Telehealth: Payer: Self-pay

## 2020-02-01 NOTE — Telephone Encounter (Signed)
Ok with me 

## 2020-02-01 NOTE — Telephone Encounter (Signed)
Okay with me.  Aw

## 2020-02-01 NOTE — Telephone Encounter (Signed)
LVM asking to call the office back.

## 2020-02-01 NOTE — Telephone Encounter (Signed)
Patient is calling in asking to do a TOC from Dr.Andy to [facility] as she feels like Dr.Wolfe would better suit her and her medical choices. Okay to do TOC?

## 2020-02-01 NOTE — Telephone Encounter (Signed)
Patient scheduled.

## 2020-03-02 DIAGNOSIS — I83891 Varicose veins of right lower extremities with other complications: Secondary | ICD-10-CM | POA: Diagnosis not present

## 2020-03-02 DIAGNOSIS — I83811 Varicose veins of right lower extremities with pain: Secondary | ICD-10-CM | POA: Diagnosis not present

## 2020-03-07 DIAGNOSIS — Z20822 Contact with and (suspected) exposure to covid-19: Secondary | ICD-10-CM | POA: Diagnosis not present

## 2020-03-08 DIAGNOSIS — Z01419 Encounter for gynecological examination (general) (routine) without abnormal findings: Secondary | ICD-10-CM | POA: Diagnosis not present

## 2020-03-08 DIAGNOSIS — Z1231 Encounter for screening mammogram for malignant neoplasm of breast: Secondary | ICD-10-CM | POA: Diagnosis not present

## 2020-03-08 DIAGNOSIS — Z6822 Body mass index (BMI) 22.0-22.9, adult: Secondary | ICD-10-CM | POA: Diagnosis not present

## 2020-03-13 DIAGNOSIS — Z20822 Contact with and (suspected) exposure to covid-19: Secondary | ICD-10-CM | POA: Diagnosis not present

## 2020-03-20 DIAGNOSIS — Z20822 Contact with and (suspected) exposure to covid-19: Secondary | ICD-10-CM | POA: Diagnosis not present

## 2020-03-27 DIAGNOSIS — Z20822 Contact with and (suspected) exposure to covid-19: Secondary | ICD-10-CM | POA: Diagnosis not present

## 2020-04-03 DIAGNOSIS — Z20822 Contact with and (suspected) exposure to covid-19: Secondary | ICD-10-CM | POA: Diagnosis not present

## 2020-04-06 DIAGNOSIS — I83891 Varicose veins of right lower extremities with other complications: Secondary | ICD-10-CM | POA: Diagnosis not present

## 2020-04-06 DIAGNOSIS — I83811 Varicose veins of right lower extremities with pain: Secondary | ICD-10-CM | POA: Diagnosis not present

## 2020-04-07 ENCOUNTER — Other Ambulatory Visit (HOSPITAL_COMMUNITY): Payer: Self-pay | Admitting: Dermatology

## 2020-04-07 DIAGNOSIS — L718 Other rosacea: Secondary | ICD-10-CM | POA: Diagnosis not present

## 2020-04-07 DIAGNOSIS — L570 Actinic keratosis: Secondary | ICD-10-CM | POA: Diagnosis not present

## 2020-04-07 DIAGNOSIS — L821 Other seborrheic keratosis: Secondary | ICD-10-CM | POA: Diagnosis not present

## 2020-04-07 DIAGNOSIS — D225 Melanocytic nevi of trunk: Secondary | ICD-10-CM | POA: Diagnosis not present

## 2020-04-07 DIAGNOSIS — D2272 Melanocytic nevi of left lower limb, including hip: Secondary | ICD-10-CM | POA: Diagnosis not present

## 2020-04-07 MED FILL — metroNIDAZOLE 0.75 % LOTN: 0.75 | 30 days supply | Qty: 59 | Fill #0

## 2020-04-13 ENCOUNTER — Encounter: Payer: 59 | Admitting: Family Medicine

## 2020-04-15 DIAGNOSIS — I83811 Varicose veins of right lower extremities with pain: Secondary | ICD-10-CM | POA: Diagnosis not present

## 2020-04-15 DIAGNOSIS — I83891 Varicose veins of right lower extremities with other complications: Secondary | ICD-10-CM | POA: Diagnosis not present

## 2020-04-28 ENCOUNTER — Encounter: Payer: Self-pay | Admitting: Family Medicine

## 2020-04-28 ENCOUNTER — Other Ambulatory Visit: Payer: Self-pay

## 2020-04-28 ENCOUNTER — Ambulatory Visit (INDEPENDENT_AMBULATORY_CARE_PROVIDER_SITE_OTHER): Payer: 59 | Admitting: Family Medicine

## 2020-04-28 ENCOUNTER — Encounter: Payer: Self-pay | Admitting: Internal Medicine

## 2020-04-28 VITALS — BP 130/76 | HR 70 | Temp 97.9°F | Ht 63.0 in | Wt 123.6 lb

## 2020-04-28 DIAGNOSIS — Z1211 Encounter for screening for malignant neoplasm of colon: Secondary | ICD-10-CM

## 2020-04-28 DIAGNOSIS — Z1152 Encounter for screening for COVID-19: Secondary | ICD-10-CM

## 2020-04-28 DIAGNOSIS — N926 Irregular menstruation, unspecified: Secondary | ICD-10-CM

## 2020-04-28 DIAGNOSIS — Z0000 Encounter for general adult medical examination without abnormal findings: Secondary | ICD-10-CM | POA: Diagnosis not present

## 2020-04-28 DIAGNOSIS — Z1159 Encounter for screening for other viral diseases: Secondary | ICD-10-CM | POA: Diagnosis not present

## 2020-04-28 LAB — LIPID PANEL
Cholesterol: 145 mg/dL (ref 0–200)
HDL: 72.7 mg/dL (ref >39.00)
LDL Cholesterol: 63 mg/dL (ref 0–99)
NonHDL: 71.84
Total CHOL/HDL Ratio: 2
Triglycerides: 46 mg/dL (ref 0.0–149.0)
VLDL: 9.2 mg/dL (ref 0.0–40.0)

## 2020-04-28 LAB — COMPREHENSIVE METABOLIC PANEL
ALT: 10 U/L (ref 0–35)
AST: 17 U/L (ref 0–37)
Albumin: 4.5 g/dL (ref 3.5–5.2)
Alkaline Phosphatase: 32 U/L — ABNORMAL LOW (ref 39–117)
BUN: 15 mg/dL (ref 6–23)
CO2: 29 mEq/L (ref 19–32)
Calcium: 9.3 mg/dL (ref 8.4–10.5)
Chloride: 103 mEq/L (ref 96–112)
Creatinine, Ser: 0.76 mg/dL (ref 0.40–1.20)
GFR: 93.02 mL/min (ref >60.00)
Glucose, Bld: 91 mg/dL (ref 70–99)
Potassium: 3.8 mEq/L (ref 3.5–5.1)
Sodium: 138 mEq/L (ref 135–145)
Total Bilirubin: 1 mg/dL (ref 0.2–1.2)
Total Protein: 7 g/dL (ref 6.0–8.3)

## 2020-04-28 LAB — CBC WITH DIFFERENTIAL/PLATELET
Basophils Absolute: 0 10*3/uL (ref 0.0–0.1)
Basophils Relative: 0.5 % (ref 0.0–3.0)
Eosinophils Absolute: 0.1 10*3/uL (ref 0.0–0.7)
Eosinophils Relative: 1.3 % (ref 0.0–5.0)
HCT: 38.1 % (ref 36.0–46.0)
Hemoglobin: 13.5 g/dL (ref 12.0–15.0)
Lymphocytes Relative: 30.2 % (ref 12.0–46.0)
Lymphs Abs: 1.8 10*3/uL (ref 0.7–4.0)
MCHC: 35.5 g/dL (ref 30.0–36.0)
MCV: 97.8 fl (ref 78.0–100.0)
Monocytes Absolute: 0.3 10*3/uL (ref 0.1–1.0)
Monocytes Relative: 5.9 % (ref 3.0–12.0)
Neutro Abs: 3.7 10*3/uL (ref 1.4–7.7)
Neutrophils Relative %: 62.1 % (ref 43.0–77.0)
Platelets: 229 10*3/uL (ref 150.0–400.0)
RBC: 3.89 Mil/uL (ref 3.87–5.11)
RDW: 12.1 % (ref 11.5–15.5)
WBC: 5.9 10*3/uL (ref 4.0–10.5)

## 2020-04-28 LAB — TSH: TSH: 1.48 u[IU]/mL (ref 0.35–4.50)

## 2020-04-28 LAB — POCT URINE PREGNANCY: Preg Test, Ur: NEGATIVE

## 2020-04-28 NOTE — Patient Instructions (Signed)
Preventive Care 84-48 Years Old, Female Preventive care refers to lifestyle choices and visits with your health care provider that can promote health and wellness. This includes:  A yearly physical exam. This is also called an annual wellness visit.  Regular dental and eye exams.  Immunizations.  Screening for certain conditions.  Healthy lifestyle choices, such as: ? Eating a healthy diet. ? Getting regular exercise. ? Not using drugs or products that contain nicotine and tobacco. ? Limiting alcohol use. What can I expect for my preventive care visit? Physical exam Your health care provider will check your:  Height and weight. These may be used to calculate your BMI (body mass index). BMI is a measurement that tells if you are at a healthy weight.  Heart rate and blood pressure.  Body temperature.  Skin for abnormal spots. Counseling Your health care provider may ask you questions about your:  Past medical problems.  Family's medical history.  Alcohol, tobacco, and drug use.  Emotional well-being.  Home life and relationship well-being.  Sexual activity.  Diet, exercise, and sleep habits.  Work and work Statistician.  Access to firearms.  Method of birth control.  Menstrual cycle.  Pregnancy history. What immunizations do I need? Vaccines are usually given at various ages, according to a schedule. Your health care provider will recommend vaccines for you based on your age, medical history, and lifestyle or other factors, such as travel or where you work.   What tests do I need? Blood tests  Lipid and cholesterol levels. These may be checked every 5 years, or more often if you are over 3 years old.  Hepatitis C test.  Hepatitis B test. Screening  Lung cancer screening. You may have this screening every year starting at age 73 if you have a 30-pack-year history of smoking and currently smoke or have quit within the past 15 years.  Colorectal cancer  screening. ? All adults should have this screening starting at age 52 and continuing until age 17. ? Your health care provider may recommend screening at age 49 if you are at increased risk. ? You will have tests every 1-10 years, depending on your results and the type of screening test.  Diabetes screening. ? This is done by checking your blood sugar (glucose) after you have not eaten for a while (fasting). ? You may have this done every 1-3 years.  Mammogram. ? This may be done every 1-2 years. ? Talk with your health care provider about when you should start having regular mammograms. This may depend on whether you have a family history of breast cancer.  BRCA-related cancer screening. This may be done if you have a family history of breast, ovarian, tubal, or peritoneal cancers.  Pelvic exam and Pap test. ? This may be done every 3 years starting at age 10. ? Starting at age 11, this may be done every 5 years if you have a Pap test in combination with an HPV test. Other tests  STD (sexually transmitted disease) testing, if you are at risk.  Bone density scan. This is done to screen for osteoporosis. You may have this scan if you are at high risk for osteoporosis. Talk with your health care provider about your test results, treatment options, and if necessary, the need for more tests. Follow these instructions at home: Eating and drinking  Eat a diet that includes fresh fruits and vegetables, whole grains, lean protein, and low-fat dairy products.  Take vitamin and mineral supplements  as recommended by your health care provider.  Do not drink alcohol if: ? Your health care provider tells you not to drink. ? You are pregnant, may be pregnant, or are planning to become pregnant.  If you drink alcohol: ? Limit how much you have to 0-1 drink a day. ? Be aware of how much alcohol is in your drink. In the [place] one drink equals one 12 oz bottle of beer (355 mL), one 5 oz glass of  wine (148 mL), or one 1 oz glass of hard liquor (44 mL).   Lifestyle  Take daily care of your teeth and gums. Brush your teeth every morning and night with fluoride toothpaste. Floss one time each day.  Stay active. Exercise for at least 30 minutes 5 or more days each week.  Do not use any products that contain nicotine or tobacco, such as cigarettes, e-cigarettes, and chewing tobacco. If you need help quitting, ask your health care provider.  Do not use drugs.  If you are sexually active, practice safe sex. Use a condom or other form of protection to prevent STIs (sexually transmitted infections).  If you do not wish to become pregnant, use a form of birth control. If you plan to become pregnant, see your health care provider for a prepregnancy visit.  If told by your health care provider, take low-dose aspirin daily starting at age 50.  Find healthy ways to cope with stress, such as: ? Meditation, yoga, or listening to music. ? Journaling. ? Talking to a trusted person. ? Spending time with friends and family. Safety  Always wear your seat belt while driving or riding in a vehicle.  Do not drive: ? If you have been drinking alcohol. Do not ride with someone who has been drinking. ? When you are tired or distracted. ? While texting.  Wear a helmet and other protective equipment during sports activities.  If you have firearms in your house, make sure you follow all gun safety procedures. What's next?  Visit your health care provider once a year for an annual wellness visit.  Ask your health care provider how often you should have your eyes and teeth checked.  Stay up to date on all vaccines. This information is not intended to replace advice given to you by your health care provider. Make sure you discuss any questions you have with your health care provider. Document Revised: 12/22/2019 Document Reviewed: 11/28/2017 Elsevier Patient Education  2021 Elsevier Inc.  

## 2020-04-28 NOTE — Progress Notes (Signed)
Patient: Carolyn Guzman MRN: [id number] DOB: Oct 15, 1970 PCP: Orma Flaming, MD     Subjective:  Chief Complaint  Patient presents with  . Annual Exam  . Transitions Of Care    HPI: The patient is a 50 y.o. female who presents today for annual exam. She denies any changes to past medical history. There have been no recent hospitalizations. They are following a well balanced diet and exercise plan. Weight has been stable. No complaints today.   She has no family history of breat or colon cancer. Due for cscope. She Is up to date on her HM.   Had an abdominal ultrasound done last year. Incidental polyp. Wants to know about reimaging. Discussed no follow up needed.   Immunization History  Administered Date(s) Administered  . Influenza,inj,Quad PF,6+ Mos 01/20/2019  . Influenza-Unspecified 01/14/2018, 01/01/2020  . Tdap 09/22/2004, 04/13/2019   Colonoscopy: due for this.  Mammogram: 03/2020 Pap smear: 01/2019   Review of Systems  Constitutional: Negative for chills, fatigue and fever.  HENT: Negative for dental problem, ear pain, hearing loss and trouble swallowing.   Eyes: Negative for visual disturbance.  Respiratory: Negative for cough, chest tightness and shortness of breath.   Cardiovascular: Negative for chest pain, palpitations and leg swelling.  Gastrointestinal: Negative for abdominal pain, blood in stool, diarrhea and nausea.  Endocrine: Negative for cold intolerance, polydipsia, polyphagia and polyuria.  Genitourinary: Negative for dysuria, frequency, hematuria and pelvic pain.  Musculoskeletal: Negative for arthralgias.  Skin: Negative for rash.  Neurological: Negative for dizziness and headaches.  Psychiatric/Behavioral: Negative for dysphoric mood and sleep disturbance. The patient is not nervous/anxious.     Allergies Patient has No Known Allergies.  Past Medical History Patient  has a past medical history of Esophageal stricture and IBS (irritable  bowel syndrome).  Surgical History Patient  has a past surgical history that includes Breast biopsy (Left, 1998); Cesarean section (2007); and Cervical biopsy w/ loop electrode excision.  Family History Pateint's family history includes Depression in her mother; Healthy in her daughter; Hypertension in her maternal grandfather, maternal grandmother, and mother; Lung cancer in her maternal grandfather; Osteoporosis in her maternal grandmother and mother.  Social History Patient  reports that she has never smoked. She has never used smokeless tobacco. She reports current alcohol use. She reports that she does not use drugs.    Objective: Vitals:   04/28/20 0958  BP: 130/76  Pulse: 70  Temp: 97.9 F (36.6 C)  TempSrc: Temporal  SpO2: 99%  Weight: 123 lb 9.6 oz (56.1 kg)  Height: 5\' 3"  (1.6 m)    Body mass index is 21.89 kg/m.  Physical Exam Vitals reviewed.  Constitutional:      Appearance: Normal appearance. She is well-developed, normal weight and well-nourished.  HENT:     Head: Normocephalic and atraumatic.     Right Ear: Tympanic membrane, ear canal and external ear normal.     Left Ear: Tympanic membrane, ear canal and external ear normal.     Nose: Nose normal.     Mouth/Throat:     Mouth: Oropharynx is clear and moist. Mucous membranes are moist.  Eyes:     Extraocular Movements: Extraocular movements intact and EOM normal.     Conjunctiva/sclera: Conjunctivae normal.     Pupils: Pupils are equal, round, and reactive to light.  Neck:     Thyroid: No thyromegaly.  Cardiovascular:     Rate and Rhythm: Normal rate and regular rhythm.     Pulses:  Normal pulses and intact distal pulses.     Heart sounds: Murmur (very quiet flow murmur) heard.    Pulmonary:     Effort: Pulmonary effort is normal.     Breath sounds: Normal breath sounds.  Abdominal:     General: Abdomen is flat. Bowel sounds are normal. There is no distension.     Palpations: Abdomen is soft.      Tenderness: There is no abdominal tenderness.  Musculoskeletal:        General: Normal range of motion.     Cervical back: Normal range of motion and neck supple.  Lymphadenopathy:     Cervical: No cervical adenopathy.  Skin:    General: Skin is warm and dry.     Capillary Refill: Capillary refill takes less than 2 seconds.     Findings: No rash.  Neurological:     General: No focal deficit present.     Mental Status: She is alert and oriented to person, place, and time.     Cranial Nerves: No cranial nerve deficit.     Coordination: Coordination normal.     Deep Tendon Reflexes: Reflexes normal.  Psychiatric:        Mood and Affect: Mood and affect and mood normal.        Behavior: Behavior normal.    [place] Office Visit from 04/28/2020 in [place]  PHQ-2 Total Score 0     Urine pregnancy: negative.     Assessment/plan: 1. Annual physical exam Routine fasting labs today. Hm reviewed. Due for cscope with new guidelines. Already established GI patient, but referral placed. utd on mmg/pap. Checking hep C today. Very fit and active. Looks great. Continue healthy lifestyle. F/u in one year. Also discussed per radiology guidance, no repeat imaging needed on tiny gallbladder polyp.  Patient counseling [x]    Nutrition: Stressed importance of moderation in sodium/caffeine intake, saturated fat and cholesterol, caloric balance, sufficient intake of fresh fruits, vegetables, fiber, calcium, iron, and 1 mg of folate supplement per day (for females capable of pregnancy).  [x]    Stressed the importance of regular exercise.   []    Substance Abuse: Discussed cessation/primary prevention of tobacco, alcohol, or other drug use; driving or other dangerous activities under the influence; availability of treatment for abuse.   [x]    Injury prevention: Discussed safety belts, safety helmets, smoke detector, smoking near bedding or upholstery.   [x]    Sexuality:  Discussed sexually transmitted diseases, partner selection, use of condoms, avoidance of unintended pregnancy  and contraceptive alternatives.  [x]    Dental health: Discussed importance of regular tooth brushing, flossing, and dental visits.  [x]    Health maintenance and immunizations reviewed. Please refer to Health maintenance section.    - CBC with Differential/Platelet - Comprehensive metabolic panel - Lipid panel - TSH  2. Encounter for hepatitis C screening test for low risk patient  - Hepatitis C antibody  3. Missed period -appear peri menopausal.  - POCT urine pregnancy - FSH/LH  4. Encounter for screening for COVID-19  - SARS CoV2 Serology(COVID19) AB(IgG,IgM),Immunoassay  5. Encounter for screening colonoscopy  - Ambulatory referral to Gastroenterology   This visit occurred during the SARS-CoV-2 public health emergency.  Safety protocols were in place, including screening questions prior to the visit, additional usage of staff PPE, and extensive cleaning of exam room while observing appropriate contact time as indicated for disinfecting solutions.     Return in about 1 year (around 04/28/2021).  Orma Flaming, MD [place]  04/28/2020

## 2020-04-29 LAB — HEPATITIS C ANTIBODY
Hepatitis C Ab: NONREACTIVE
SIGNAL TO CUT-OFF: 0.01 (ref <1.00)

## 2020-04-29 LAB — SARS COV-2 SEROLOGY(COVID-19)AB(IGG,IGM),IMMUNOASSAY
SARS CoV-2 AB IgG: NEGATIVE
SARS CoV-2 IgM: NEGATIVE

## 2020-04-29 LAB — FSH/LH
FSH: 16.7 m[IU]/mL
LH: 11 m[IU]/mL

## 2020-05-06 DIAGNOSIS — I83811 Varicose veins of right lower extremities with pain: Secondary | ICD-10-CM | POA: Diagnosis not present

## 2020-05-06 DIAGNOSIS — I8311 Varicose veins of right lower extremity with inflammation: Secondary | ICD-10-CM | POA: Diagnosis not present

## 2020-05-11 DIAGNOSIS — I8311 Varicose veins of right lower extremity with inflammation: Secondary | ICD-10-CM | POA: Diagnosis not present

## 2020-05-23 DIAGNOSIS — I8311 Varicose veins of right lower extremity with inflammation: Secondary | ICD-10-CM | POA: Diagnosis not present

## 2020-05-26 ENCOUNTER — Encounter: Payer: Self-pay | Admitting: Internal Medicine

## 2020-05-26 ENCOUNTER — Other Ambulatory Visit: Payer: Self-pay

## 2020-05-26 ENCOUNTER — Ambulatory Visit (AMBULATORY_SURGERY_CENTER): Payer: 59

## 2020-05-26 ENCOUNTER — Other Ambulatory Visit: Payer: Self-pay | Admitting: Internal Medicine

## 2020-05-26 VITALS — Ht 63.0 in | Wt 121.0 lb

## 2020-05-26 DIAGNOSIS — Z1211 Encounter for screening for malignant neoplasm of colon: Secondary | ICD-10-CM

## 2020-05-26 MED ORDER — NA SULFATE-K SULFATE-MG SULF 17.5-3.13-1.6 GM/177ML PO SOLN: 1.0000 | Freq: Once | ORAL | 0 refills | Status: DC

## 2020-05-26 MED FILL — SUPREP BOWEL PREP KIT: 17.5-3.13-1 | 1 days supply | Qty: 354 | Fill #0

## 2020-05-26 NOTE — Progress Notes (Signed)
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  Pt aware of COVID protocols and [facility] guidelines

## 2020-06-01 DIAGNOSIS — M7981 Nontraumatic hematoma of soft tissue: Secondary | ICD-10-CM | POA: Diagnosis not present

## 2020-06-01 DIAGNOSIS — I8311 Varicose veins of right lower extremity with inflammation: Secondary | ICD-10-CM | POA: Diagnosis not present

## 2020-06-10 ENCOUNTER — Other Ambulatory Visit: Payer: Self-pay

## 2020-06-10 ENCOUNTER — Encounter: Payer: Self-pay | Admitting: Internal Medicine

## 2020-06-10 ENCOUNTER — Ambulatory Visit (AMBULATORY_SURGERY_CENTER): Payer: 59 | Admitting: Internal Medicine

## 2020-06-10 VITALS — BP 102/61 | HR 67 | Temp 97.3°F | Resp 14 | Ht 63.0 in | Wt 121.0 lb

## 2020-06-10 DIAGNOSIS — Z1211 Encounter for screening for malignant neoplasm of colon: Secondary | ICD-10-CM | POA: Diagnosis not present

## 2020-06-10 DIAGNOSIS — K219 Gastro-esophageal reflux disease without esophagitis: Secondary | ICD-10-CM | POA: Diagnosis not present

## 2020-06-10 MED ORDER — SODIUM CHLORIDE 0.9 % IV SOLN: 500.0000 mL | Freq: Once | INTRAVENOUS | Status: DC

## 2020-06-10 NOTE — Op Note (Signed)
[facility] Patient Name: Carolyn Guzman Procedure Date: 06/10/2020 3:15 PM MRN: [id number] Endoscopist: Gatha Mayer , MD Age: 50 Referring MD:  Date of Birth: 05-24-1970 Gender: Female Account #: [id number] Procedure:                Colonoscopy Indications:              Screening for colorectal malignant neoplasm, This                            is the patient's first colonoscopy Medicines:                Propofol per Anesthesia, Monitored Anesthesia Care Procedure:                Pre-Anesthesia Assessment:                           - Prior to the procedure, a History and Physical                            was performed, and patient medications and                            allergies were reviewed. The patient's tolerance of                            previous anesthesia was also reviewed. The risks                            and benefits of the procedure and the sedation                            options and risks were discussed with the patient.                            All questions were answered, and informed consent                            was obtained. Prior Anticoagulants: The patient has                            taken no previous anticoagulant or antiplatelet                            agents. ASA Grade Assessment: II - A patient with                            mild systemic disease. After reviewing the risks                            and benefits, the patient was deemed in                            satisfactory condition to undergo the procedure.  After obtaining informed consent, the colonoscope                            was passed under direct vision. Throughout the                            procedure, the patient's blood pressure, pulse, and                            oxygen saturations were monitored continuously. The                            Olympus PCF-H190DL ([id number]) Colonoscope was                             introduced through the anus and advanced to the the                            cecum, identified by appendiceal orifice and                            ileocecal valve. The colonoscopy was somewhat                            difficult due to significant looping and a tortuous                            colon. Successful completion of the procedure was                            aided by applying abdominal pressure. The quality                            of the bowel preparation was excellent. The patient                            tolerated the procedure fairly well - some vomiting                            occuyrred - see CRNA notes. The bowel preparation                            used was SUPREP via split dose instruction. Scope In: 3:43:37 PM Scope Out: 4:07:26 PM Scope Withdrawal Time: 0 hours 11 minutes 0 seconds  Total Procedure Duration: 0 hours 23 minutes 49 seconds  Findings:                 The perianal and digital rectal examinations were                            normal.                           The entire examined colon appeared normal on direct  and retroflexion views. Complications:            No immediate complications. Estimated Blood Loss:     Estimated blood loss: none. Impression:               - The entire examined colon is normal on direct and                            retroflexion views.                           - No specimens collected. Recommendation:           - Patient has a contact number available for                            emergencies. The signs and symptoms of potential                            delayed complications were discussed with the                            patient. Return to normal activities tomorrow.                            Written discharge instructions were provided to the                            patient.                           - Resume previous diet.                           - Continue present  medications.                           - Repeat colonoscopy in 10 years for screening                            purposes. Gatha Mayer, MD 06/10/2020 4:14:58 PM This report has been signed electronically.

## 2020-06-10 NOTE — Patient Instructions (Addendum)
Normal colonoscopy!  Next routine colonoscopy or other screening test in 10 years - 2032.  I appreciate the opportunity to care for you. Gatha Mayer, MD, FACG  YOU HAD AN ENDOSCOPIC PROCEDURE TODAY AT [facility]:   Refer to the procedure report that was given to you for any specific questions about what was found during the examination.  If the procedure report does not answer your questions, please call your gastroenterologist to clarify.  If you requested that your care partner not be given the details of your procedure findings, then the procedure report has been included in a sealed envelope for you to review at your convenience later.  YOU SHOULD EXPECT: Some feelings of bloating in the abdomen. Passage of more gas than usual.  Walking can help get rid of the air that was put into your GI tract during the procedure and reduce the bloating. If you had a lower endoscopy (such as a colonoscopy or flexible sigmoidoscopy) you may notice spotting of blood in your stool or on the toilet paper. If you underwent a bowel prep for your procedure, you may not have a normal bowel movement for a few days.  Please Note:  You might notice some irritation and congestion in your nose or some drainage.  This is from the oxygen used during your procedure.  There is no need for concern and it should clear up in a day or so.  SYMPTOMS TO REPORT IMMEDIATELY:   Following lower endoscopy (colonoscopy or flexible sigmoidoscopy):  Excessive amounts of blood in the stool  Significant tenderness or worsening of abdominal pains  Swelling of the abdomen that is new, acute  Fever of 100F or higher For urgent or emergent issues, a gastroenterologist can be reached at any hour by calling [phone]. Do not use MyChart messaging for urgent concerns.    DIET:  We do recommend a small meal at first, but then you may proceed to your regular diet.  Drink plenty of fluids but you should avoid  alcoholic beverages for 24 hours.  ACTIVITY:  You should plan to take it easy for the rest of today and you should NOT DRIVE or use heavy machinery until tomorrow (because of the sedation medicines used during the test).    FOLLOW UP: Our staff will call the number listed on your records 48-72 hours following your procedure to check on you and address any questions or concerns that you may have regarding the information given to you following your procedure. If we do not reach you, we will leave a message.  We will attempt to reach you two times.  During this call, we will ask if you have developed any symptoms of COVID 19. If you develop any symptoms (ie: fever, flu-like symptoms, shortness of breath, cough etc.) before then, please call [phone].  If you test positive for Covid 19 in the 2 weeks post procedure, please call and report this information to [place].    If any biopsies were taken you will be contacted by phone or by letter within the next 1-3 weeks.  Please call us at [phone] if you have not heard about the biopsies in 3 weeks.    SIGNATURES/CONFIDENTIALITY: You and/or your care partner have signed paperwork which will be entered into your electronic medical record.  These signatures attest to the fact that that the information above on your After Visit Summary has been reviewed and is understood.  Full responsibility of the confidentiality  of this discharge information lies with you and/or your care-partner.

## 2020-06-10 NOTE — Progress Notes (Signed)
Medical history reviewed with no changes noted. VS assessed by C.W 

## 2020-06-10 NOTE — Progress Notes (Signed)
1550 Patient experiencing nausea and vomiting.  MD updated and Zofran 4 mg IV given, vss

## 2020-06-10 NOTE — Progress Notes (Signed)
Report given to PACU, vss 

## 2020-06-15 ENCOUNTER — Telehealth: Payer: Self-pay | Admitting: *Deleted

## 2020-06-15 NOTE — Telephone Encounter (Signed)
No answer for post procedure call back. Left message for patient will call back.

## 2020-06-15 NOTE — Telephone Encounter (Signed)
  Follow up Call-  Call back number 06/10/2020  Post procedure Call Back phone  # [phone]  Permission to leave phone message Yes  Some recent data might be hidden     No answer at 2nd attempt follow up phone call.  Left message on voicemail.

## 2020-06-20 ENCOUNTER — Encounter: Payer: 59 | Admitting: Internal Medicine

## 2020-07-29 ENCOUNTER — Encounter: Payer: Self-pay | Admitting: Family Medicine

## 2020-10-31 ENCOUNTER — Other Ambulatory Visit (HOSPITAL_COMMUNITY): Payer: Self-pay

## 2021-03-09 DIAGNOSIS — Z01419 Encounter for gynecological examination (general) (routine) without abnormal findings: Secondary | ICD-10-CM | POA: Diagnosis not present

## 2021-03-09 DIAGNOSIS — Z6821 Body mass index (BMI) 21.0-21.9, adult: Secondary | ICD-10-CM | POA: Diagnosis not present

## 2021-03-09 DIAGNOSIS — Z1231 Encounter for screening mammogram for malignant neoplasm of breast: Secondary | ICD-10-CM | POA: Diagnosis not present

## 2021-03-17 DIAGNOSIS — H3562 Retinal hemorrhage, left eye: Secondary | ICD-10-CM | POA: Diagnosis not present

## 2021-03-17 DIAGNOSIS — H35372 Puckering of macula, left eye: Secondary | ICD-10-CM | POA: Diagnosis not present

## 2021-03-17 DIAGNOSIS — H5203 Hypermetropia, bilateral: Secondary | ICD-10-CM | POA: Diagnosis not present

## 2021-03-31 IMAGING — US US ABDOMEN LIMITED
1 series · 14 of 25 positions shown · non-contrast
Comparison: None.

CLINICAL DATA: 48-year-old female with history of elevated
bilirubin and intermittent right upper quadrant abdominal pain.

EXAM:
ULTRASOUND ABDOMEN LIMITED RIGHT UPPER QUADRANT

[Series 1: us abdomen limited · 14 of 71 slices shown]
[im 1/71]
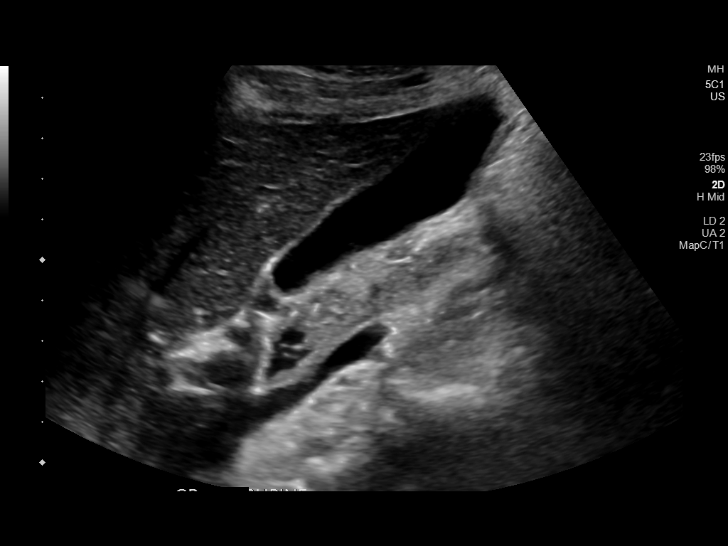
[im 6/71]
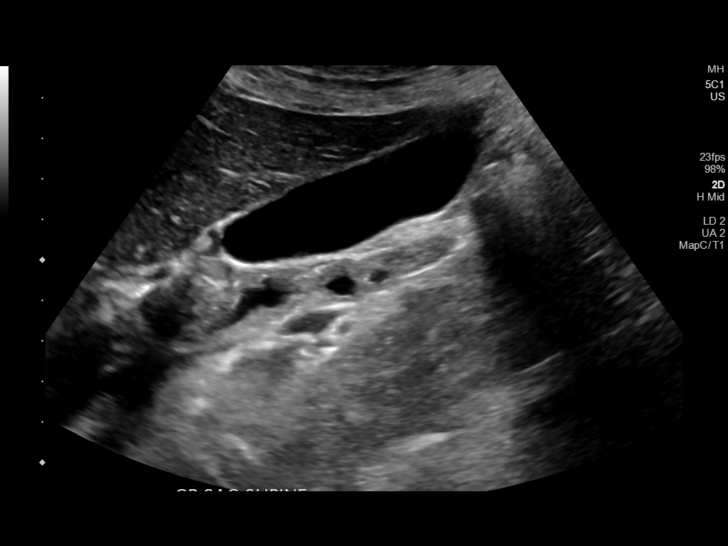
[im 12/71]
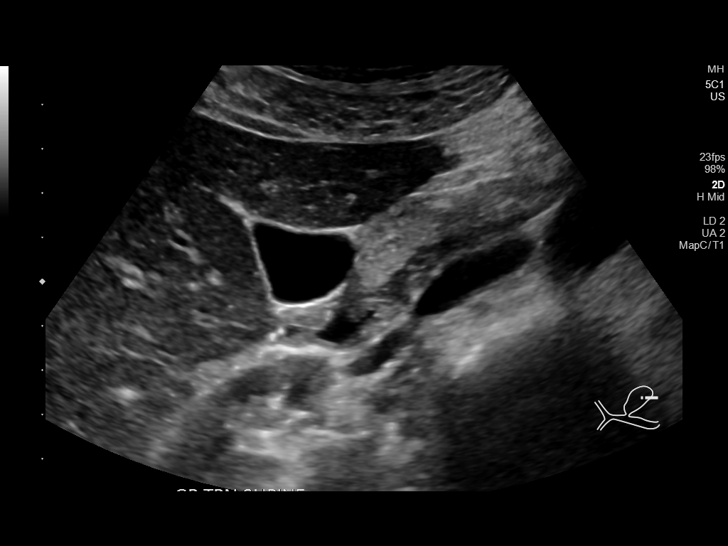
[im 18/71]
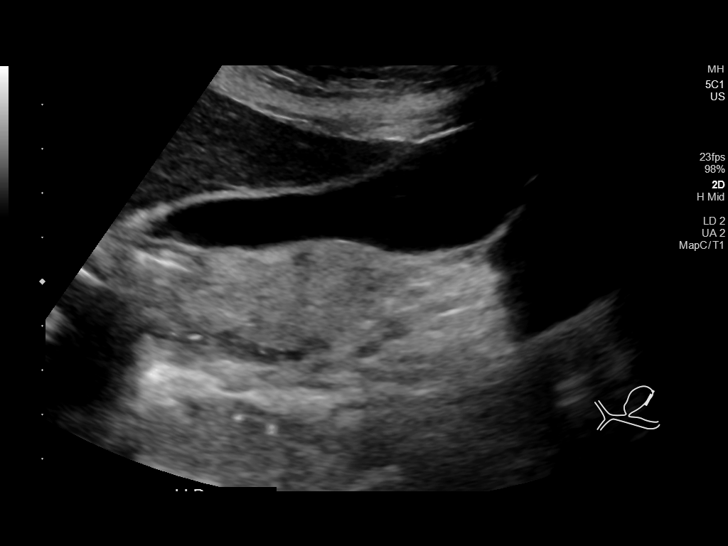
[im 24/71]
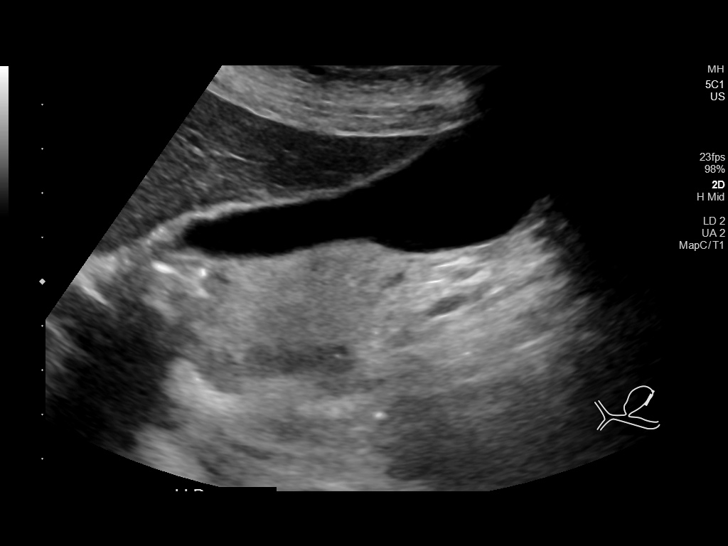
[im 27/71]
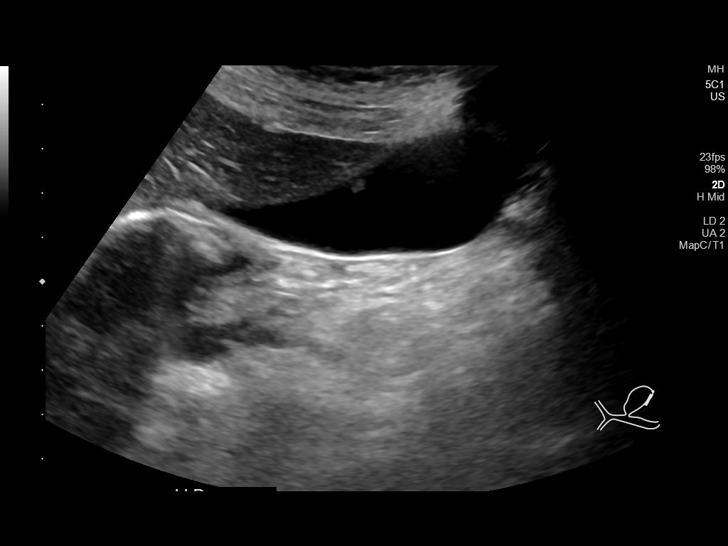
[im 33/71]
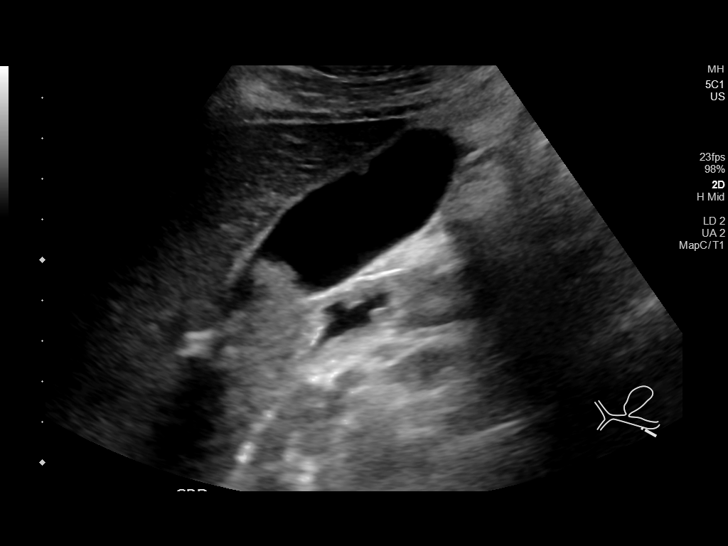
[im 38/71]
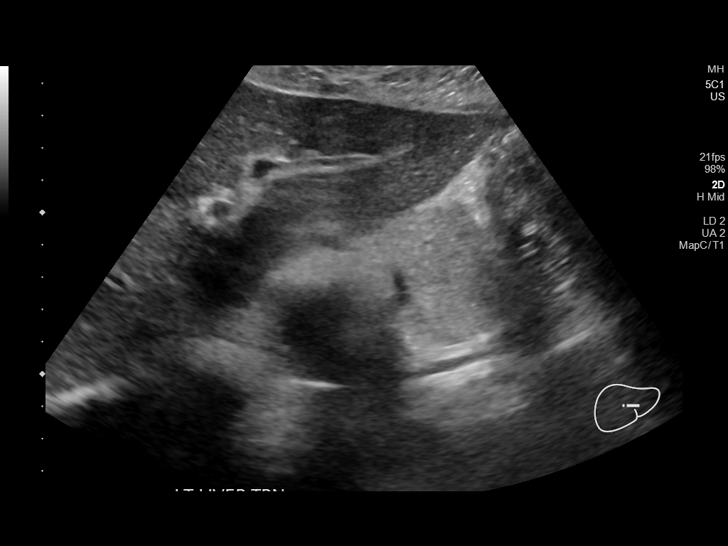
[im 44/71]
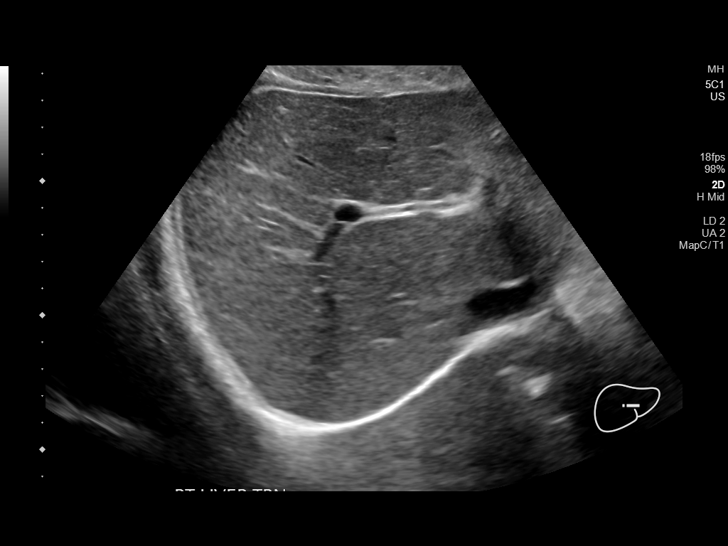
[im 47/71]
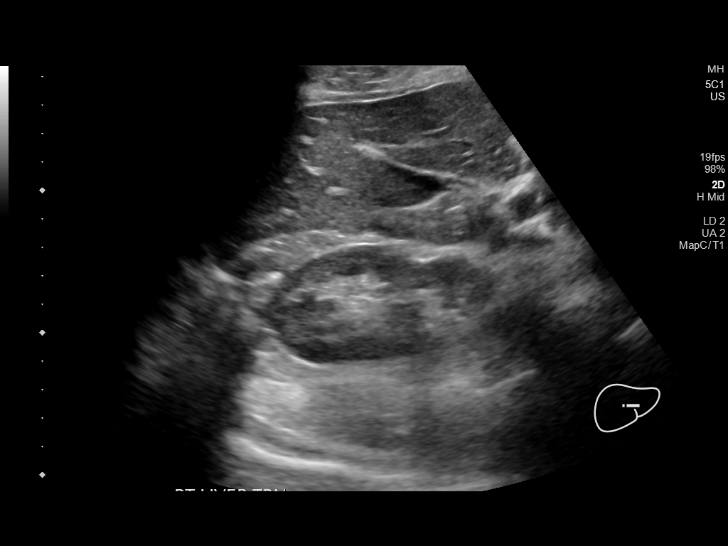
[im 53/71]
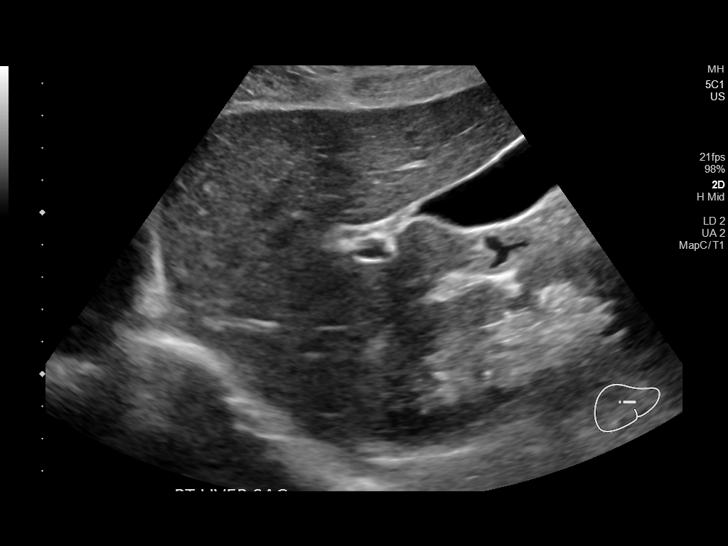
[im 59/71]
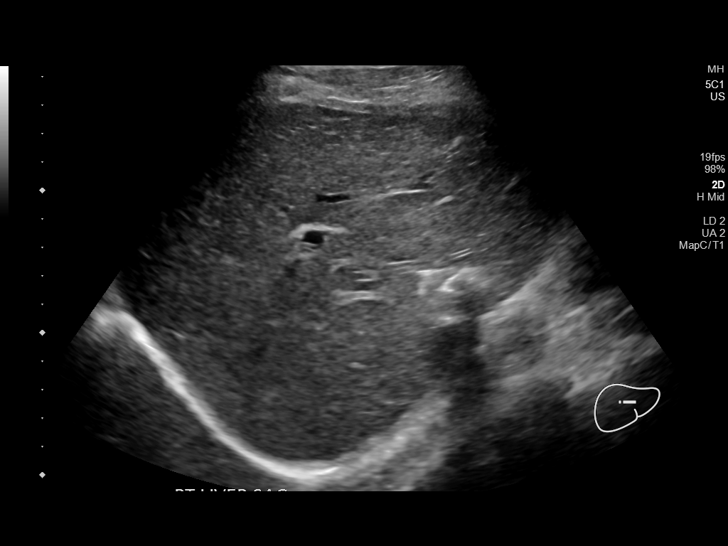
[im 65/71]
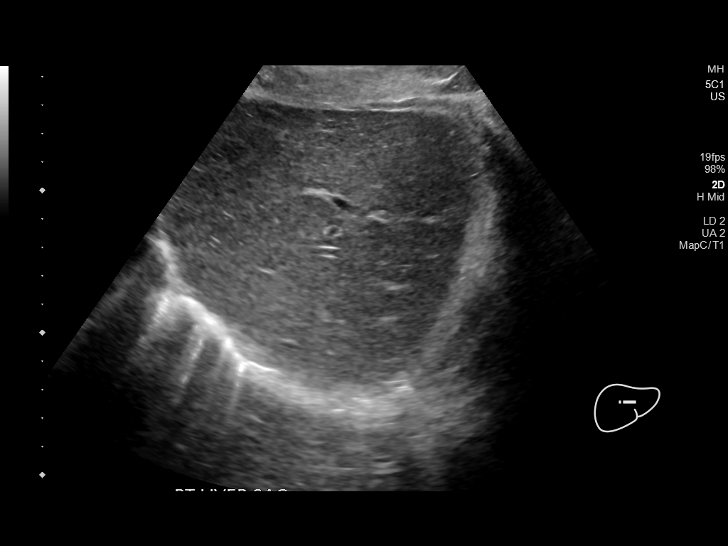
[im 71/71]
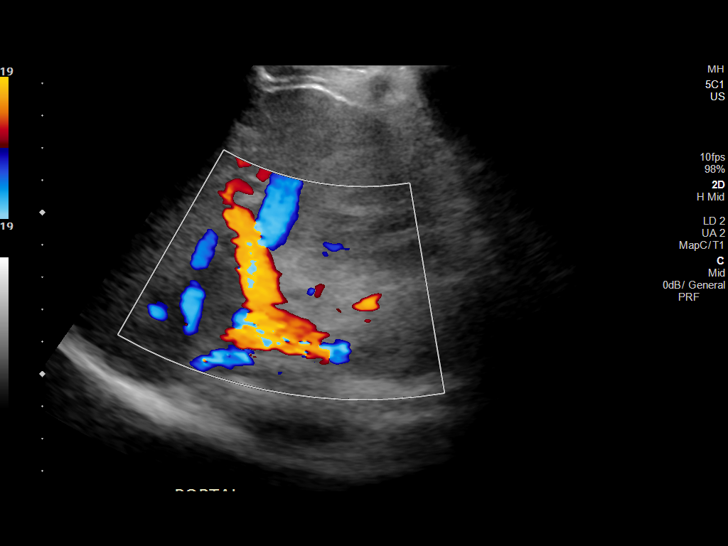

[14 of 25 positions shown; findings below may reference images not displayed]

FINDINGS: Gallbladder:

No gallstones or wall thickening visualized. Immobile non dependent
2.6 mm echogenic focus without posterior acoustic shadowing,
compatible with a tiny gallbladder polyp. No sonographic Murphy sign
noted by sonographer.

Common bile duct:

Diameter:

Liver:

No focal lesion identified. Within normal limits in parenchymal
echogenicity. Portal vein is patent on color Doppler imaging with
normal direction of blood flow towards the liver.

Other: None.
IMPRESSION: 1. No acute findings. Specifically, no evidence of cholelithiasis or
biliary tract obstruction.
2. Tiny gallbladder wall polyp measuring 2.6 mm. This is
statistically benign and requires no future imaging follow-up.

## 2021-04-19 DIAGNOSIS — L821 Other seborrheic keratosis: Secondary | ICD-10-CM | POA: Diagnosis not present

## 2021-04-19 DIAGNOSIS — D225 Melanocytic nevi of trunk: Secondary | ICD-10-CM | POA: Diagnosis not present

## 2021-04-19 DIAGNOSIS — L814 Other melanin hyperpigmentation: Secondary | ICD-10-CM | POA: Diagnosis not present

## 2021-04-19 DIAGNOSIS — L718 Other rosacea: Secondary | ICD-10-CM | POA: Diagnosis not present

## 2021-05-03 ENCOUNTER — Other Ambulatory Visit: Payer: Self-pay

## 2021-05-03 ENCOUNTER — Ambulatory Visit (INDEPENDENT_AMBULATORY_CARE_PROVIDER_SITE_OTHER): Payer: 59 | Admitting: Physician Assistant

## 2021-05-03 ENCOUNTER — Encounter: Payer: 59 | Admitting: Family Medicine

## 2021-05-03 VITALS — BP 116/75 | HR 61 | Temp 98.0°F | Ht 63.0 in | Wt 121.5 lb

## 2021-05-03 DIAGNOSIS — E559 Vitamin D deficiency, unspecified: Secondary | ICD-10-CM | POA: Diagnosis not present

## 2021-05-03 DIAGNOSIS — Z0000 Encounter for general adult medical examination without abnormal findings: Secondary | ICD-10-CM

## 2021-05-03 LAB — COMPREHENSIVE METABOLIC PANEL
ALT: 12 U/L (ref 0–35)
AST: 24 U/L (ref 0–37)
Albumin: 4.5 g/dL (ref 3.5–5.2)
Alkaline Phosphatase: 47 U/L (ref 39–117)
BUN: 18 mg/dL (ref 6–23)
CO2: 29 mEq/L (ref 19–32)
Calcium: 9.2 mg/dL (ref 8.4–10.5)
Chloride: 104 mEq/L (ref 96–112)
Creatinine, Ser: 0.89 mg/dL (ref 0.40–1.20)
GFR: 76.42 mL/min (ref >60.00)
Glucose, Bld: 86 mg/dL (ref 70–99)
Potassium: 3.9 mEq/L (ref 3.5–5.1)
Sodium: 139 mEq/L (ref 135–145)
Total Bilirubin: 0.8 mg/dL (ref 0.2–1.2)
Total Protein: 6.8 g/dL (ref 6.0–8.3)

## 2021-05-03 LAB — CBC WITH DIFFERENTIAL/PLATELET
Basophils Absolute: 0 10*3/uL (ref 0.0–0.1)
Basophils Relative: 0.4 % (ref 0.0–3.0)
Eosinophils Absolute: 0.1 10*3/uL (ref 0.0–0.7)
Eosinophils Relative: 2.9 % (ref 0.0–5.0)
HCT: 39.1 % (ref 36.0–46.0)
Hemoglobin: 13.5 g/dL (ref 12.0–15.0)
Lymphocytes Relative: 35.1 % (ref 12.0–46.0)
Lymphs Abs: 1.5 10*3/uL (ref 0.7–4.0)
MCHC: 34.4 g/dL (ref 30.0–36.0)
MCV: 98.2 fl (ref 78.0–100.0)
Monocytes Absolute: 0.3 10*3/uL (ref 0.1–1.0)
Monocytes Relative: 6.6 % (ref 3.0–12.0)
Neutro Abs: 2.3 10*3/uL (ref 1.4–7.7)
Neutrophils Relative %: 55 % (ref 43.0–77.0)
Platelets: 240 10*3/uL (ref 150.0–400.0)
RBC: 3.99 Mil/uL (ref 3.87–5.11)
RDW: 12.3 % (ref 11.5–15.5)
WBC: 4.2 10*3/uL (ref 4.0–10.5)

## 2021-05-03 LAB — LIPID PANEL
Cholesterol: 150 mg/dL (ref 0–200)
HDL: 69.9 mg/dL (ref >39.00)
LDL Cholesterol: 70 mg/dL (ref 0–99)
NonHDL: 80.51
Total CHOL/HDL Ratio: 2
Triglycerides: 53 mg/dL (ref 0.0–149.0)
VLDL: 10.6 mg/dL (ref 0.0–40.0)

## 2021-05-03 LAB — VITAMIN D 25 HYDROXY (VIT D DEFICIENCY, FRACTURES): VITD: 48.6 ng/mL (ref 30.00–100.00)

## 2021-05-03 LAB — TSH: TSH: 1.43 u[IU]/mL (ref 0.35–5.50)

## 2021-05-03 NOTE — Progress Notes (Signed)
Subjective:    Patient ID: Gelene Mink, female    DOB: Nov 02, 1970, 51 y.o.   MRN: [id number]  Chief Complaint  Patient presents with   Annual Exam    HPI Patient is in today for [facility] from Dr. Rogers Blocker and annual CPE.  Acute concerns: No health concerns  She sees GYN and dermatology annually.  Health maintenance: Lifestyle/ exercise: Crossfit 5-6 days / week  Nutrition: Meal preps often  Mental health: Doing well Caffeine: 1-2 cups of coffee per day  Sleep: Doing well  Substance use: None ETOH: Wine 1-2 times per week Sexual activity: Monogamous  Colonoscopy: UTD Pap: UTD Mammogram: UTD   Past Medical History:  Diagnosis Date   Allergy    seasonal allergies   Esophageal stricture    IBS (irritable bowel syndrome)    Osteopenia     Past Surgical History:  Procedure Laterality Date   BREAST BIOPSY Left 1998   CERVICAL BIOPSY  W/ LOOP ELECTRODE EXCISION     CESAREAN SECTION  2007    Family History  Problem Relation Age of Onset   Depression Mother    Hypertension Mother    Osteoporosis Mother    Healthy Daughter    Osteoporosis Maternal Grandmother    Hypertension Maternal Grandmother    Lung cancer Maternal Grandfather    Hypertension Maternal Grandfather    Stroke Neg Hx    Dementia Neg Hx     Social History   Tobacco Use   Smoking status: Never   Smokeless tobacco: Never  Vaping Use   Vaping Use: Never used  Substance Use Topics   Alcohol use: Yes    Alcohol/week: 10.0 standard drinks    Types: 10 Standard drinks or equivalent per week   Drug use: Never     No Known Allergies  Review of Systems NEGATIVE UNLESS OTHERWISE INDICATED IN HPI      Objective:     BP 116/75    Pulse 61    Temp 98 F (36.7 C)    Ht 5\' 3"  (1.6 m)    Wt 121 lb 8 oz (55.1 kg)    SpO2 99%    BMI 21.52 kg/m   Wt Readings from Last 3 Encounters:  05/03/21 121 lb 8 oz (55.1 kg)  06/10/20 121 lb (54.9 kg)  05/26/20 121 lb (54.9 kg)    BP Readings from  Last 3 Encounters:  05/03/21 116/75  06/10/20 102/61  04/28/20 130/76     Physical Exam Vitals and nursing note reviewed.  Constitutional:      Appearance: Normal appearance. She is normal weight. She is not toxic-appearing.  HENT:     Head: Normocephalic and atraumatic.     Right Ear: Tympanic membrane, ear canal and external ear normal.     Left Ear: Tympanic membrane, ear canal and external ear normal.     Nose: Nose normal.     Mouth/Throat:     Mouth: Mucous membranes are moist.  Eyes:     Extraocular Movements: Extraocular movements intact.     Conjunctiva/sclera: Conjunctivae normal.     Pupils: Pupils are equal, round, and reactive to light.  Cardiovascular:     Rate and Rhythm: Normal rate and regular rhythm.     Pulses: Normal pulses.     Heart sounds: Normal heart sounds.  Pulmonary:     Effort: Pulmonary effort is normal.     Breath sounds: Normal breath sounds.  Abdominal:     General:  Abdomen is flat. Bowel sounds are normal.     Palpations: Abdomen is soft.  Musculoskeletal:        General: Normal range of motion.     Cervical back: Normal range of motion and neck supple.  Skin:    General: Skin is warm and dry.  Neurological:     General: No focal deficit present.     Mental Status: She is alert and oriented to person, place, and time.  Psychiatric:        Mood and Affect: Mood normal.        Behavior: Behavior normal.        Thought Content: Thought content normal.        Judgment: Judgment normal.       Assessment & Plan:   Problem List Items Addressed This Visit   None Visit Diagnoses     Encounter for annual physical exam    -  Primary   Relevant Orders   CBC with Differential/Platelet   Comprehensive metabolic panel   Lipid panel   TSH   VITAMIN D 25 Hydroxy (Vit-D Deficiency, Fractures)   Vitamin D deficiency       Relevant Orders   VITAMIN D 25 Hydroxy (Vit-D Deficiency, Fractures)        Plan: Age-appropriate screening and  counseling performed today. Will check labs and call with results. Preventive measures discussed and printed in AVS for patient. She is doing fantastic with healthy lifestyle. Encouraged her to keep up the good work and f/up in 1 year or prn.    Kellianne Ek M Caliya Narine, PA-C

## 2021-10-16 ENCOUNTER — Other Ambulatory Visit (HOSPITAL_COMMUNITY): Payer: Self-pay

## 2021-10-16 MED ORDER — TOBRAMYCIN-DEXAMETHASONE 0.3-0.1 % OP SUSP
OPHTHALMIC | 0 refills | Status: DC
  Filled 2021-10-16: qty 5, 7d supply, fill #0

## 2021-12-25 ENCOUNTER — Encounter: Payer: Self-pay | Admitting: *Deleted

## 2022-03-15 ENCOUNTER — Encounter: Payer: Self-pay | Admitting: *Deleted

## 2022-03-15 DIAGNOSIS — Z1231 Encounter for screening mammogram for malignant neoplasm of breast: Secondary | ICD-10-CM | POA: Diagnosis not present

## 2022-03-15 DIAGNOSIS — Z6821 Body mass index (BMI) 21.0-21.9, adult: Secondary | ICD-10-CM | POA: Diagnosis not present

## 2022-03-15 DIAGNOSIS — Z124 Encounter for screening for malignant neoplasm of cervix: Secondary | ICD-10-CM | POA: Diagnosis not present

## 2022-03-15 DIAGNOSIS — Z01419 Encounter for gynecological examination (general) (routine) without abnormal findings: Secondary | ICD-10-CM | POA: Diagnosis not present

## 2022-03-15 DIAGNOSIS — Z1151 Encounter for screening for human papillomavirus (HPV): Secondary | ICD-10-CM | POA: Diagnosis not present

## 2022-03-20 ENCOUNTER — Other Ambulatory Visit: Payer: Self-pay | Admitting: Obstetrics and Gynecology

## 2022-03-20 DIAGNOSIS — R928 Other abnormal and inconclusive findings on diagnostic imaging of breast: Secondary | ICD-10-CM

## 2022-03-28 ENCOUNTER — Other Ambulatory Visit: Payer: Self-pay | Admitting: Obstetrics and Gynecology

## 2022-03-28 DIAGNOSIS — N6489 Other specified disorders of breast: Secondary | ICD-10-CM | POA: Diagnosis not present

## 2022-03-28 DIAGNOSIS — R921 Mammographic calcification found on diagnostic imaging of breast: Secondary | ICD-10-CM

## 2022-04-05 ENCOUNTER — Ambulatory Visit
Admission: RE | Admit: 2022-04-05 | Discharge: 2022-04-05 | Disposition: A | Discharge status: Home / Self Care | Payer: Commercial Managed Care - PPO | Source: Ambulatory Visit | Attending: Obstetrics and Gynecology | Admitting: Obstetrics and Gynecology

## 2022-04-05 DIAGNOSIS — R921 Mammographic calcification found on diagnostic imaging of breast: Secondary | ICD-10-CM | POA: Diagnosis not present

## 2022-04-05 DIAGNOSIS — N6012 Diffuse cystic mastopathy of left breast: Secondary | ICD-10-CM | POA: Diagnosis not present

## 2022-04-05 DIAGNOSIS — N6011 Diffuse cystic mastopathy of right breast: Secondary | ICD-10-CM | POA: Diagnosis not present

## 2022-04-05 HISTORY — PX: BREAST BIOPSY: SHX20

## 2022-04-25 ENCOUNTER — Other Ambulatory Visit (HOSPITAL_COMMUNITY): Payer: Self-pay

## 2022-04-25 DIAGNOSIS — L821 Other seborrheic keratosis: Secondary | ICD-10-CM | POA: Diagnosis not present

## 2022-04-25 DIAGNOSIS — D1801 Hemangioma of skin and subcutaneous tissue: Secondary | ICD-10-CM | POA: Diagnosis not present

## 2022-04-25 DIAGNOSIS — D225 Melanocytic nevi of trunk: Secondary | ICD-10-CM | POA: Diagnosis not present

## 2022-04-25 DIAGNOSIS — L718 Other rosacea: Secondary | ICD-10-CM | POA: Diagnosis not present

## 2022-04-25 DIAGNOSIS — D2271 Melanocytic nevi of right lower limb, including hip: Secondary | ICD-10-CM | POA: Diagnosis not present

## 2022-04-25 DIAGNOSIS — D2272 Melanocytic nevi of left lower limb, including hip: Secondary | ICD-10-CM | POA: Diagnosis not present

## 2022-04-25 MED ORDER — DOXYCYCLINE MONOHYDRATE 100 MG PO CAPS
100.0000 mg | ORAL_CAPSULE | Freq: Every day | ORAL | 2 refills | Status: DC
  Filled 2022-04-25: qty 30, 30d supply, fill #0

## 2022-04-26 ENCOUNTER — Other Ambulatory Visit (HOSPITAL_COMMUNITY): Payer: Self-pay

## 2022-04-30 ENCOUNTER — Other Ambulatory Visit: Payer: Commercial Managed Care - PPO

## 2022-05-03 ENCOUNTER — Other Ambulatory Visit (HOSPITAL_COMMUNITY): Payer: Self-pay

## 2022-05-04 ENCOUNTER — Encounter: Payer: 59 | Admitting: Physician Assistant

## 2022-05-07 ENCOUNTER — Encounter: Payer: 59 | Admitting: Physician Assistant

## 2022-05-28 ENCOUNTER — Encounter: Payer: 59 | Admitting: Physician Assistant

## 2022-06-08 ENCOUNTER — Ambulatory Visit (INDEPENDENT_AMBULATORY_CARE_PROVIDER_SITE_OTHER): Payer: Commercial Managed Care - PPO | Admitting: Physician Assistant

## 2022-06-08 ENCOUNTER — Encounter: Payer: Self-pay | Admitting: Physician Assistant

## 2022-06-08 VITALS — BP 125/64 | HR 63 | Temp 98.2°F | Ht 63.0 in | Wt 119.8 lb

## 2022-06-08 DIAGNOSIS — Z0000 Encounter for general adult medical examination without abnormal findings: Secondary | ICD-10-CM | POA: Diagnosis not present

## 2022-06-08 NOTE — Progress Notes (Signed)
Subjective:    Patient ID: Carolyn Guzman, female    DOB: May 20, 1970, 52 y.o.   MRN: [id number]  Chief Complaint  Patient presents with   Annual Exam    Pt has no questions or concerns , and pt not fasting     HPI Patient is in today for annual exam. No changes to medical history in last year other than -Breast biopsy recently - negative. Daughter, Aniston, diagnosed with melanoma in the last year and currently going through treatments. Some stress assoc with that, but otherwise no major changes. Doing well. Staying active, exercising and eating well.    Past Medical History:  Diagnosis Date   Allergy    seasonal allergies   Esophageal stricture    IBS (irritable bowel syndrome)    Osteopenia     Past Surgical History:  Procedure Laterality Date   BREAST BIOPSY Left 1998   BREAST BIOPSY Right 04/05/2022   MM RT BREAST BX W LOC DEV 1ST LESION IMAGE BX SPEC STEREO GUIDE 04/05/2022 [facility]   BREAST BIOPSY Left 04/05/2022   MM LT BREAST BX W LOC DEV 1ST LESION IMAGE BX SPEC STEREO GUIDE 04/05/2022 [facility]   CERVICAL BIOPSY  W/ LOOP ELECTRODE EXCISION     CESAREAN SECTION  2007    Family History  Problem Relation Age of Onset   Depression Mother    Hypertension Mother    Osteoporosis Mother    Anxiety disorder Father    Osteoporosis Maternal Grandmother    Hypertension Maternal Grandmother    Lung cancer Maternal Grandfather    Hypertension Maternal Grandfather    Melanoma Daughter    Stroke Neg Hx    Dementia Neg Hx     Social History   Tobacco Use   Smoking status: Never   Smokeless tobacco: Never  Vaping Use   Vaping Use: Never used  Substance Use Topics   Alcohol use: Yes    Comment: social   Drug use: Never     No Known Allergies  Review of Systems NEGATIVE UNLESS OTHERWISE INDICATED IN HPI      Objective:     BP 125/64 (BP Location: Left Arm, Patient Position: Sitting)   Pulse 63   Temp 98.2 F (36.8 C) (Temporal)    Ht '5\' 3"'$  (1.6 m)   Wt 119 lb 12.8 oz (54.3 kg)   LMP 06/08/2022 (Exact Date)   SpO2 99%   BMI 21.22 kg/m   Wt Readings from Last 3 Encounters:  06/08/22 119 lb 12.8 oz (54.3 kg)  05/03/21 121 lb 8 oz (55.1 kg)  06/10/20 121 lb (54.9 kg)    BP Readings from Last 3 Encounters:  06/08/22 125/64  05/03/21 116/75  06/10/20 102/61     Physical Exam Vitals and nursing note reviewed.  Constitutional:      Appearance: Normal appearance. She is normal weight. She is not toxic-appearing.  HENT:     Head: Normocephalic and atraumatic.     Right Ear: Tympanic membrane, ear canal and external ear normal.     Left Ear: Tympanic membrane, ear canal and external ear normal.     Nose: Nose normal.     Mouth/Throat:     Mouth: Mucous membranes are moist.  Eyes:     Extraocular Movements: Extraocular movements intact.     Conjunctiva/sclera: Conjunctivae normal.     Pupils: Pupils are equal, round, and reactive to light.  Cardiovascular:     Rate and Rhythm: Normal  rate and regular rhythm.     Pulses: Normal pulses.     Heart sounds: Normal heart sounds.  Pulmonary:     Effort: Pulmonary effort is normal.     Breath sounds: Normal breath sounds.  Abdominal:     General: Abdomen is flat. Bowel sounds are normal.     Palpations: Abdomen is soft.  Musculoskeletal:        General: Normal range of motion.     Cervical back: Normal range of motion and neck supple.  Skin:    General: Skin is warm and dry.  Neurological:     General: No focal deficit present.     Mental Status: She is alert and oriented to person, place, and time.  Psychiatric:        Mood and Affect: Mood normal.        Behavior: Behavior normal.        Thought Content: Thought content normal.        Judgment: Judgment normal.        Assessment & Plan:  Encounter for annual physical exam  Age-appropriate screening and counseling performed today.  Labs solidly normal last year, no changes, no symptoms or  concerns, defer to next yr.  Preventive measures discussed and printed in AVS for patient.   Patient Counseling: '[x]'$   Nutrition: Stressed importance of moderation in sodium/caffeine intake, saturated fat and cholesterol, caloric balance, sufficient intake of fresh fruits, vegetables, and fiber.  '[x]'$   Stressed the importance of regular exercise.   '[]'$   Substance Abuse: Discussed cessation/primary prevention of tobacco, alcohol, or other drug use; driving or other dangerous activities under the influence; availability of treatment for abuse.   '[]'$   Injury prevention: Discussed safety belts, safety helmets, smoke detector, smoking near bedding or upholstery.   '[]'$   Sexuality: Discussed sexually transmitted diseases, partner selection, use of condoms, avoidance of unintended pregnancy  and contraceptive alternatives.   '[x]'$   Dental health: Discussed importance of regular tooth brushing, flossing, and dental visits.  '[x]'$   Health maintenance and immunizations reviewed. Please refer to Health maintenance section.       Chere Babson M Dana Debo, PA-C

## 2022-08-10 ENCOUNTER — Other Ambulatory Visit (HOSPITAL_COMMUNITY): Payer: Self-pay

## 2022-09-04 ENCOUNTER — Other Ambulatory Visit: Payer: Self-pay | Admitting: Obstetrics and Gynecology

## 2022-09-04 DIAGNOSIS — R921 Mammographic calcification found on diagnostic imaging of breast: Secondary | ICD-10-CM

## 2022-09-05 DIAGNOSIS — H5203 Hypermetropia, bilateral: Secondary | ICD-10-CM | POA: Diagnosis not present

## 2022-10-10 ENCOUNTER — Ambulatory Visit
Admission: RE | Admit: 2022-10-10 | Discharge: 2022-10-10 | Disposition: A | Discharge status: Home / Self Care | Payer: Commercial Managed Care - PPO | Source: Ambulatory Visit | Attending: Obstetrics and Gynecology | Admitting: Obstetrics and Gynecology

## 2022-10-10 DIAGNOSIS — Z09 Encounter for follow-up examination after completed treatment for conditions other than malignant neoplasm: Secondary | ICD-10-CM | POA: Diagnosis not present

## 2022-10-10 DIAGNOSIS — R921 Mammographic calcification found on diagnostic imaging of breast: Secondary | ICD-10-CM

## 2022-10-10 DIAGNOSIS — N6011 Diffuse cystic mastopathy of right breast: Secondary | ICD-10-CM | POA: Diagnosis not present

## 2022-10-10 DIAGNOSIS — N6012 Diffuse cystic mastopathy of left breast: Secondary | ICD-10-CM | POA: Diagnosis not present

## 2022-10-11 ENCOUNTER — Other Ambulatory Visit: Payer: Self-pay | Admitting: Oncology

## 2022-10-11 DIAGNOSIS — Z006 Encounter for examination for normal comparison and control in clinical research program: Secondary | ICD-10-CM

## 2023-04-10 DIAGNOSIS — F4323 Adjustment disorder with mixed anxiety and depressed mood: Secondary | ICD-10-CM | POA: Diagnosis not present

## 2023-04-17 DIAGNOSIS — Z01419 Encounter for gynecological examination (general) (routine) without abnormal findings: Secondary | ICD-10-CM | POA: Diagnosis not present

## 2023-04-17 DIAGNOSIS — Z6822 Body mass index (BMI) 22.0-22.9, adult: Secondary | ICD-10-CM | POA: Diagnosis not present

## 2023-04-17 DIAGNOSIS — Z780 Asymptomatic menopausal state: Secondary | ICD-10-CM | POA: Diagnosis not present

## 2023-04-17 DIAGNOSIS — Z124 Encounter for screening for malignant neoplasm of cervix: Secondary | ICD-10-CM | POA: Diagnosis not present

## 2023-06-06 DIAGNOSIS — D225 Melanocytic nevi of trunk: Secondary | ICD-10-CM | POA: Diagnosis not present

## 2023-06-06 DIAGNOSIS — L821 Other seborrheic keratosis: Secondary | ICD-10-CM | POA: Diagnosis not present

## 2023-06-06 DIAGNOSIS — D485 Neoplasm of uncertain behavior of skin: Secondary | ICD-10-CM | POA: Diagnosis not present

## 2023-06-06 DIAGNOSIS — L570 Actinic keratosis: Secondary | ICD-10-CM | POA: Diagnosis not present

## 2023-06-06 DIAGNOSIS — L820 Inflamed seborrheic keratosis: Secondary | ICD-10-CM | POA: Diagnosis not present

## 2023-06-13 ENCOUNTER — Encounter: Payer: Commercial Managed Care - PPO | Admitting: Physician Assistant

## 2023-07-24 DIAGNOSIS — F4320 Adjustment disorder, unspecified: Secondary | ICD-10-CM | POA: Diagnosis not present

## 2023-08-14 ENCOUNTER — Other Ambulatory Visit: Payer: Self-pay

## 2023-08-15 DIAGNOSIS — F4320 Adjustment disorder, unspecified: Secondary | ICD-10-CM | POA: Diagnosis not present

## 2023-08-16 ENCOUNTER — Other Ambulatory Visit: Payer: Self-pay

## 2023-08-28 DIAGNOSIS — F4320 Adjustment disorder, unspecified: Secondary | ICD-10-CM | POA: Diagnosis not present

## 2023-09-06 ENCOUNTER — Other Ambulatory Visit: Payer: Self-pay | Admitting: Obstetrics and Gynecology

## 2023-09-06 DIAGNOSIS — Z1231 Encounter for screening mammogram for malignant neoplasm of breast: Secondary | ICD-10-CM

## 2023-09-12 ENCOUNTER — Other Ambulatory Visit: Payer: Self-pay

## 2023-09-12 ENCOUNTER — Telehealth: Payer: Self-pay

## 2023-09-12 DIAGNOSIS — Z0000 Encounter for general adult medical examination without abnormal findings: Secondary | ICD-10-CM

## 2023-09-12 NOTE — Telephone Encounter (Signed)
 Copied from CRM [id number]. Topic: Clinical - Request for Lab/Test Order >> Sep 12, 2023  8:36 AM Martinique E wrote: Reason for CRM: Patient called in having to reschedule her physical and is worried, because she has had to reschedule this physical multiple times due to personal reasons. Patient questioning if PCP is able to put in lab orders so she can at least have her blood work checked before that physical without needing to wait until August.  Please see caller concern and advise if ok to place future orders for CPE prior to August. Pt last seen March 2024.

## 2023-09-12 NOTE — Telephone Encounter (Signed)
 Mychart message sent to patient advising future lab orders placed and instructions to fast for these labs. Nothing further needed at this time

## 2023-09-24 ENCOUNTER — Encounter: Admitting: Physician Assistant

## 2023-10-23 ENCOUNTER — Ambulatory Visit
Admission: RE | Admit: 2023-10-23 | Discharge: 2023-10-23 | Disposition: A | Discharge status: Home / Self Care | Source: Ambulatory Visit | Attending: Obstetrics and Gynecology | Admitting: Obstetrics and Gynecology

## 2023-10-23 DIAGNOSIS — Z1231 Encounter for screening mammogram for malignant neoplasm of breast: Secondary | ICD-10-CM

## 2023-11-12 ENCOUNTER — Encounter: Admitting: Physician Assistant

## 2023-11-20 ENCOUNTER — Other Ambulatory Visit (INDEPENDENT_AMBULATORY_CARE_PROVIDER_SITE_OTHER)

## 2023-11-20 DIAGNOSIS — Z0000 Encounter for general adult medical examination without abnormal findings: Secondary | ICD-10-CM | POA: Diagnosis not present

## 2023-11-20 LAB — CBC WITH DIFFERENTIAL/PLATELET
Basophils Absolute: 0 K/uL (ref 0.0–0.1)
Basophils Relative: 0.4 % (ref 0.0–3.0)
Eosinophils Absolute: 0.1 K/uL (ref 0.0–0.7)
Eosinophils Relative: 1.1 % (ref 0.0–5.0)
HCT: 39.5 % (ref 36.0–46.0)
Hemoglobin: 13.6 g/dL (ref 12.0–15.0)
Lymphocytes Relative: 26.8 % (ref 12.0–46.0)
Lymphs Abs: 1.9 K/uL (ref 0.7–4.0)
MCHC: 34.5 g/dL (ref 30.0–36.0)
MCV: 97.4 fl (ref 78.0–100.0)
Monocytes Absolute: 0.4 K/uL (ref 0.1–1.0)
Monocytes Relative: 5.7 % (ref 3.0–12.0)
Neutro Abs: 4.7 K/uL (ref 1.4–7.7)
Neutrophils Relative %: 66 % (ref 43.0–77.0)
Platelets: 259 K/uL (ref 150.0–400.0)
RBC: 4.06 Mil/uL (ref 3.87–5.11)
RDW: 12.4 % (ref 11.5–15.5)
WBC: 7.1 K/uL (ref 4.0–10.5)

## 2023-11-20 LAB — COMPREHENSIVE METABOLIC PANEL WITH GFR
ALT: 13 U/L (ref 0–35)
AST: 24 U/L (ref 0–37)
Albumin: 4.6 g/dL (ref 3.5–5.2)
Alkaline Phosphatase: 41 U/L (ref 39–117)
BUN: 15 mg/dL (ref 6–23)
CO2: 30 meq/L (ref 19–32)
Calcium: 9.2 mg/dL (ref 8.4–10.5)
Chloride: 102 meq/L (ref 96–112)
Creatinine, Ser: 0.87 mg/dL (ref 0.40–1.20)
GFR: 77.14 mL/min (ref >60.00)
Glucose, Bld: 97 mg/dL (ref 70–99)
Potassium: 4.1 meq/L (ref 3.5–5.1)
Sodium: 140 meq/L (ref 135–145)
Total Bilirubin: 0.7 mg/dL (ref 0.2–1.2)
Total Protein: 7.3 g/dL (ref 6.0–8.3)

## 2023-11-20 LAB — LIPID PANEL
Cholesterol: 169 mg/dL (ref 0–200)
HDL: 90.8 mg/dL (ref >39.00)
LDL Cholesterol: 63 mg/dL (ref 0–99)
NonHDL: 78.4
Total CHOL/HDL Ratio: 2
Triglycerides: 77 mg/dL (ref 0.0–149.0)
VLDL: 15.4 mg/dL (ref 0.0–40.0)

## 2023-11-20 LAB — TSH: TSH: 0.96 u[IU]/mL (ref 0.35–5.50)

## 2023-11-20 LAB — VITAMIN D 25 HYDROXY (VIT D DEFICIENCY, FRACTURES): VITD: 35.42 ng/mL (ref 30.00–100.00)

## 2023-11-21 ENCOUNTER — Ambulatory Visit: Payer: Self-pay | Admitting: Physician Assistant

## 2024-01-21 ENCOUNTER — Other Ambulatory Visit: Payer: Self-pay | Admitting: Medical Genetics

## 2024-01-21 DIAGNOSIS — Z006 Encounter for examination for normal comparison and control in clinical research program: Secondary | ICD-10-CM

## 2024-02-05 DIAGNOSIS — F4310 Post-traumatic stress disorder, unspecified: Secondary | ICD-10-CM | POA: Diagnosis not present

## 2024-02-19 DIAGNOSIS — F4310 Post-traumatic stress disorder, unspecified: Secondary | ICD-10-CM | POA: Diagnosis not present

## 2024-02-24 LAB — GENECONNECT MOLECULAR SCREEN: Genetic Analysis Overall Interpretation: NEGATIVE

## 2024-03-06 DIAGNOSIS — F4310 Post-traumatic stress disorder, unspecified: Secondary | ICD-10-CM | POA: Diagnosis not present

## 2024-04-29 ENCOUNTER — Other Ambulatory Visit (HOSPITAL_COMMUNITY): Payer: Self-pay

## 2024-04-29 MED ORDER — PROGESTERONE MICRONIZED 100 MG PO CAPS
200.0000 mg | ORAL_CAPSULE | Freq: Every day | ORAL | 0 refills | Status: AC
  Filled 2024-04-29: qty 90, 45d supply, fill #0
